# Patient Record
Sex: Female | Born: 1980 | Race: White | Hispanic: No | Marital: Married | Smoking: Current every day smoker
Health system: Southern US, Community
[De-identification: ages and names within clinical notes are randomized; demographics above are authoritative.]

## PROBLEM LIST (undated history)

## (undated) ENCOUNTER — Ambulatory Visit: Admission: EM | Payer: 59

## (undated) DIAGNOSIS — M109 Gout, unspecified: Secondary | ICD-10-CM

## (undated) DIAGNOSIS — E079 Disorder of thyroid, unspecified: Secondary | ICD-10-CM

## (undated) DIAGNOSIS — F419 Anxiety disorder, unspecified: Secondary | ICD-10-CM

## (undated) DIAGNOSIS — I471 Supraventricular tachycardia, unspecified: Secondary | ICD-10-CM

## (undated) DIAGNOSIS — G43909 Migraine, unspecified, not intractable, without status migrainosus: Secondary | ICD-10-CM

## (undated) HISTORY — DX: Anxiety disorder, unspecified: F41.9

## (undated) HISTORY — PX: HERNIA REPAIR: SHX51

## (undated) HISTORY — PX: OTHER SURGICAL HISTORY: SHX169

## (undated) HISTORY — DX: Disorder of thyroid, unspecified: E07.9

## (undated) HISTORY — DX: Migraine, unspecified, not intractable, without status migrainosus: G43.909

## (undated) HISTORY — PX: WISDOM TOOTH EXTRACTION: SHX21

---

## 2005-03-01 ENCOUNTER — Emergency Department: Payer: Self-pay | Admitting: General Practice

## 2006-01-22 ENCOUNTER — Emergency Department: Payer: Self-pay | Admitting: Emergency Medicine

## 2009-07-30 ENCOUNTER — Emergency Department: Payer: Self-pay | Admitting: Emergency Medicine

## 2009-09-12 ENCOUNTER — Emergency Department: Payer: Self-pay | Admitting: Emergency Medicine

## 2009-12-30 ENCOUNTER — Inpatient Hospital Stay: Payer: Self-pay | Admitting: Internal Medicine

## 2011-01-15 ENCOUNTER — Emergency Department: Payer: Self-pay | Admitting: Emergency Medicine

## 2012-02-13 ENCOUNTER — Emergency Department: Payer: Self-pay | Admitting: Emergency Medicine

## 2012-02-13 LAB — URINALYSIS, COMPLETE
Bilirubin,UR: NEGATIVE
Glucose,UR: NEGATIVE mg/dL (ref 0–75)
Nitrite: NEGATIVE
Ph: 6 (ref 4.5–8.0)
Protein: 100
Squamous Epithelial: 8

## 2012-02-13 LAB — CBC
HCT: 39.9 % (ref 35.0–47.0)
MCH: 37.3 pg — ABNORMAL HIGH (ref 26.0–34.0)
MCHC: 33.4 g/dL (ref 32.0–36.0)
MCV: 112 fL — ABNORMAL HIGH (ref 80–100)
RBC: 3.57 10*6/uL — ABNORMAL LOW (ref 3.80–5.20)

## 2012-02-13 LAB — COMPREHENSIVE METABOLIC PANEL
Calcium, Total: 8.8 mg/dL (ref 8.5–10.1)
Chloride: 105 mmol/L (ref 98–107)
EGFR (African American): >60
EGFR (Non-African Amer.): >60
Potassium: 3.4 mmol/L — ABNORMAL LOW (ref 3.5–5.1)
Sodium: 135 mmol/L — ABNORMAL LOW (ref 136–145)
Total Protein: 6.9 g/dL (ref 6.4–8.2)

## 2012-02-13 LAB — HCG, QUANTITATIVE, PREGNANCY: Beta Hcg, Quant.: 136 m[IU]/mL — ABNORMAL HIGH

## 2014-07-19 ENCOUNTER — Emergency Department: Payer: Self-pay | Admitting: Student

## 2014-09-17 ENCOUNTER — Observation Stay: Payer: Self-pay | Admitting: Certified Nurse Midwife

## 2014-10-03 ENCOUNTER — Observation Stay: Payer: Self-pay | Admitting: Obstetrics and Gynecology

## 2014-10-04 ENCOUNTER — Inpatient Hospital Stay: Payer: Self-pay | Admitting: Obstetrics and Gynecology

## 2014-10-04 LAB — CBC WITH DIFFERENTIAL/PLATELET
Basophil #: 0.1 10*3/uL (ref 0.0–0.1)
Basophil %: 0.3 %
Eosinophil #: 0.1 10*3/uL (ref 0.0–0.7)
Eosinophil %: 1 %
HCT: 42.7 % (ref 35.0–47.0)
HGB: 14.4 g/dL (ref 12.0–16.0)
Lymphocyte #: 2.1 10*3/uL (ref 1.0–3.6)
Lymphocyte %: 14.1 %
MCH: 32.8 pg (ref 26.0–34.0)
MCHC: 33.8 g/dL (ref 32.0–36.0)
MCV: 97 fL (ref 80–100)
MONOS PCT: 9.2 %
Monocyte #: 1.4 x10 3/mm — ABNORMAL HIGH (ref 0.2–0.9)
NEUTROS ABS: 11.2 10*3/uL — AB (ref 1.4–6.5)
Neutrophil %: 75.4 %
Platelet: 261 10*3/uL (ref 150–440)
RBC: 4.4 10*6/uL (ref 3.80–5.20)
RDW: 13.9 % (ref 11.5–14.5)
WBC: 14.9 10*3/uL — ABNORMAL HIGH (ref 3.6–11.0)

## 2014-10-05 LAB — HEMATOCRIT: HCT: 38.1 % (ref 35.0–47.0)

## 2015-01-13 NOTE — H&P (Signed)
L&D Evaluation:  History Expanded:  HPI Pt is a 34 yo G4P1021 at 2820w5d weeks GA with an EDC of 09/29/14 presents to l&d with reports of regular uterine contractions since 1130 last night. She reports +FM, denies vb and lof. Her prenatal course is significant for smoking, and hypothyroidism (she takes no medication for this). She is A+, RI, VI, GBS negative and Tdap/Flu vaccine UTD.   Blood Type (Maternal) A positive   Group B Strep Results Maternal (Result >5wks must be treated as unknown) negative  09/04/14   Maternal HIV Negative   Maternal Syphilis Ab Nonreactive   Maternal Varicella Immune   Rubella Results (Maternal) immune   Patient's Medical History Thyroid Disease  migraines, anxiety   Patient's Surgical History hernia repair, wisdom teeth   Medications Pre Natal Vitamins   Allergies NKDA   Social History tobacco  5 cigs/day   Family History Non-Contributory   ROS:  ROS All systems were reviewed.  HEENT, CNS, GI, GU, Respiratory, CV, Renal and Musculoskeletal systems were found to be normal.   Exam:  Vital Signs T98.4, P83, BP134/75, RR18, 100%RA   General no apparent distress   Mental Status clear   Chest mild inspiratory wheezing at bases bilaterally   Heart normal sinus rhythm   Abdomen gravid, non-tender   Estimated Fetal Weight 8.5lb   Fetal Position ceph   Back no CVAT   Edema no edema   Pelvic no external lesions, 7cm per RN at admission   Mebranes Intact   FHT normal rate with no decels, 135, moderate variability, +accels, occasional periodic variable decelerations (shallow, short duration)   Ucx irregular, 3 q 10 min   Skin dry, no lesions, no rashes   Lymph no lymphadenopathy   Impression:  Impression reactive NST, 1) Intrauterine pregnancy at 5620w5d gestational age, 2) Active labor   Plan:  Comments 1) Labor: expectant management, patient has SROM(@320am ) with light meconium.  2) Fetus - category I tracing overall  3) PNL A  positive / ABSC negative / RI / VZI / HIV neg / RPR NR / HBsAg neg / 1st trimester screen negative / msAFP negative / 1-hr OGTT 142, passed 3h with 1 abnormal at 2h / GBS negative / total weight gain this pregnancy = ~50lb   4) TDAP/flu vaccines given 08/07/14  5) Disposition - home postpartum   Labs:  Lab Results:  Routine Hem:  30-Jan-16 00:23   WBC (CBC)  14.9  Hemoglobin (CBC) 14.4  Hematocrit (CBC) 42.7  Platelet Count (CBC) 261   Electronic Signatures: Conard NovakJackson, Stephen D (MD)  (Signed 30-Jan-16 07:11)  Authored: L&D Evaluation, Labs   Last Updated: 30-Jan-16 07:11 by Conard NovakJackson, Stephen D (MD)

## 2015-01-13 NOTE — H&P (Signed)
L&D Evaluation:  History:  HPI Pt is a 34 yo G4P1021 at 38.[redacted] weeks GA with an EDC of 09/29/14 presents to l&d with reports of back pain and pressure. She reports that she has had the back pain for several weeks but it is now getting worse to the point to where she is having a hard time walking and sitting on the toilet is painful. She reports calling the office and was told that there were no open appointments and that she should go to the hospital. She reports +FM, denies vb, lof or ctx. She also denies any dysuria,  Her prenatal course is significant for smoking, and hypothyroidism. She is A+, RI, VI, GBS negative and Tdap/Flu vaccine UTD.   Presents with back pain   Patient's Medical History Thyroid Disease  migraines, anxiety   Patient's Surgical History hernia repair, wisdom teeth   Medications Pre Natal Vitamins   Allergies NKDA   Social History tobacco   Family History Non-Contributory   ROS:  ROS All systems were reviewed.  HEENT, CNS, GI, GU, Respiratory, CV, Renal and Musculoskeletal systems were found to be normal.   Exam:  Vital Signs stable   General no apparent distress   Mental Status clear   Chest clear   Heart normal sinus rhythm   Abdomen gravid, non-tender   Mebranes Intact   FHT normal rate with no decels, 135-140s, moderate variability, +accels, no decels   Ucx irregular, occassional ctx with some uterine irritability   Skin dry, no lesions, no rashes   Lymph no lymphadenopathy   Impression:  Impression reactive NST, back pain   Plan:  Plan discharge, comfort measures discussed   Follow Up Appointment already scheduled   Electronic Signatures: Jannet MantisSubudhi, Millee Denise (CNM)  (Signed 13-Jan-16 20:53)  Authored: L&D Evaluation   Last Updated: 13-Jan-16 20:53 by Jannet MantisSubudhi, Neli Fofana (CNM)

## 2016-07-10 ENCOUNTER — Emergency Department: Payer: Medicaid Other

## 2016-07-10 ENCOUNTER — Encounter: Payer: Self-pay | Admitting: Emergency Medicine

## 2016-07-10 ENCOUNTER — Emergency Department
Admission: EM | Admit: 2016-07-10 | Discharge: 2016-07-10 | Disposition: A | Discharge status: Home / Self Care | Payer: Medicaid Other | Attending: Emergency Medicine | Admitting: Emergency Medicine

## 2016-07-10 DIAGNOSIS — M545 Low back pain, unspecified: Secondary | ICD-10-CM

## 2016-07-10 LAB — POCT PREGNANCY, URINE: Preg Test, Ur: NEGATIVE

## 2016-07-10 MED ORDER — MELOXICAM 15 MG PO TABS: 15.0000 mg | ORAL_TABLET | Freq: Every day | ORAL | 2 refills | Status: DC

## 2016-07-10 MED ORDER — HYDROCODONE-ACETAMINOPHEN 5-325 MG PO TABS
2.0000 | ORAL_TABLET | Freq: Once | ORAL | Status: AC
  Administered 2016-07-10: 2 via ORAL
  Filled 2016-07-10: qty 2

## 2016-07-10 MED ORDER — HYDROCODONE-ACETAMINOPHEN 5-325 MG PO TABS: 1.0000 | ORAL_TABLET | ORAL | 0 refills | Status: DC | PRN

## 2016-07-10 NOTE — ED Provider Notes (Signed)
[facility] Emergency Department Provider Note  ____________________________________________   First MD Initiated Contact with Patient 07/10/16 1207     (approximate)  I have reviewed the triage vital signs and the nursing notes.   HISTORY  Chief Complaint Back Pain   HPI Bethany Castaneda is a 35 y.o. female is here with complaint of back pain which originates in the center of back and radiates down the right side of her leg. Patient denies any incontinence of bowel or bladder. Patient states that she has not had any urinary tract symptoms or history of kidney stones. Patient states that she has had back pain off and on since her children were born. Her youngest child is now 7617. Patient states she is not to follow-up or see anyone about her back. Patient states she's had low back pain for the last 4 days. She denies any fever, chills, nausea or vomiting. She denies any hematuria. Patient states that she has not had her back x-rayed.Currently she rates her pain as 10 over 10.   History reviewed. No pertinent past medical history.  There are no active problems to display for this patient.   No past surgical history on file.  Prior to Admission medications   Medication Sig Start Date End Date Taking? Authorizing Provider  HYDROcodone-acetaminophen (NORCO/VICODIN) 5-325 MG tablet Take 1 tablet by mouth every 4 (four) hours as needed for moderate pain. 07/10/16   Tommi Rumpshonda L Raysha Tilmon, PA-C  meloxicam (MOBIC) 15 MG tablet Take 1 tablet (15 mg total) by mouth daily. 07/10/16 07/10/17  Tommi Rumpshonda L Jorgina Binning, PA-C    Allergies Patient has no known allergies.  No family history on file.  Social History Social History  Substance Use Topics  . Smoking status: Not on file  . Smokeless tobacco: Not on file  . Alcohol use Not on file    Review of Systems Constitutional: No fever/chills ENT: No sore throat. Cardiovascular: Denies chest pain. Respiratory: Denies  shortness of breath. Gastrointestinal: No abdominal pain.  No nausea, no vomiting.  No diarrhea.  No constipation. Genitourinary: Negative for dysuria. Musculoskeletal: Positive for low back pain. Skin: Negative for rash. Neurological: Negative for headaches, focal weakness or numbness.  10-point ROS otherwise negative.  ____________________________________________   PHYSICAL EXAM:  VITAL SIGNS: ED Triage Vitals  Enc Vitals Group     BP 07/10/16 1124 134/85     Pulse Rate 07/10/16 1124 87     Resp 07/10/16 1124 16     Temp 07/10/16 1124 98.7 F (37.1 C)     Temp Source 07/10/16 1124 Oral     SpO2 07/10/16 1124 98 %     Weight 07/10/16 1124 155 lb (70.3 kg)     Height 07/10/16 1124 5\' 6"  (1.676 m)     Head Circumference --      Peak Flow --      Pain Score 07/10/16 1129 10     Pain Loc --      Pain Edu? --      Excl. in GC? --     Constitutional: Alert and oriented. Well appearing and in no acute distress. Eyes: Conjunctivae are normal. PERRL. EOMI. Head: Atraumatic. Nose: No congestion/rhinnorhea. Neck: No stridor.   Cardiovascular: Normal rate, regular rhythm. Grossly normal heart sounds.  Good peripheral circulation. Respiratory: Normal respiratory effort.  No retractions. Lungs CTAB. Musculoskeletal: On examination there is no gross deformity noted. There is tenderness on palpation of the L5-S1 S1 sacral area and paravertebral  muscles bilaterally. Straight leg raises were approximately 40 with discomfort bilaterally. Good muscle strength was noted bilaterally. No muscle spasms were seen. Neurologic:  Normal speech and language. No gross focal neurologic deficits are appreciated. No gait instability. Reflexes are 2+ bilaterally. Skin:  Skin is warm, dry and intact. No rash noted. Psychiatric: Mood and affect are normal. Speech and behavior are normal.  ____________________________________________   LABS (all labs ordered are listed, but only abnormal results are  displayed)  Labs Reviewed  POC URINE PREG, ED  POCT PREGNANCY, URINE   ____________________________________________  RADIOLOGY Lumbar spine x-ray per radiologist: Mild degenerative change without acute abnormality  I, Tommi Rumpshonda L Ailany Koren, personally viewed and evaluated these images (plain radiographs) as part of my medical decision making, as well as reviewing the written report by the radiologist.  ____________________________________________   PROCEDURES  Procedure(s) performed: None  Procedures  Critical Care performed: No  ____________________________________________   INITIAL IMPRESSION / ASSESSMENT AND PLAN / ED COURSE  Pertinent labs & imaging results that were available during my care of the patient were reviewed by me and considered in my medical decision making (see chart for details).    Clinical Course   While in the emergency room patient was given Norco 2 tablets prior to x-rays. She appears to be resting without severe pain. Patient was given a prescription for meloxicam 15 mg one daily with food along with Norco No. 15 tablets. Patient is to follow-up with Dr. Martha ClanKrasinski if any continued problems with her back. She is also encouraged to use ice or heat to her back as needed.  ____________________________________________   FINAL CLINICAL IMPRESSION(S) / ED DIAGNOSES  Final diagnoses:  Acute bilateral low back pain without sciatica      NEW MEDICATIONS STARTED DURING THIS VISIT:  Discharge Medication List as of 07/10/2016  2:33 PM    START taking these medications   Details  HYDROcodone-acetaminophen (NORCO/VICODIN) 5-325 MG tablet Take 1 tablet by mouth every 4 (four) hours as needed for moderate pain., Starting Sun 07/10/2016, Print    meloxicam (MOBIC) 15 MG tablet Take 1 tablet (15 mg total) by mouth daily., Starting Sun 07/10/2016, Until Mon 07/10/2017, Print         Note:  This document was prepared using Dragon voice recognition software  and may include unintentional dictation errors.    Tommi RumpsRhonda L Jovi Alvizo, PA-C 07/10/16 1530    Minna AntisKevin Paduchowski, MD 07/10/16 1531

## 2016-07-10 NOTE — ED Triage Notes (Signed)
Pt reports low back pain for four days. Pt denies dysuria. Pt denies fever. Pt denies NVD.

## 2016-07-10 NOTE — Discharge Instructions (Signed)
Follow-up with Dr. Martha ClanKrasinski if any continued problems with your back. Take medication only as directed. Meloxicam 15 mg once daily with food. Norco as needed for severe pain. Do not operate machinery or drive while taking Norco as this could cause drowsiness. Use ice or heat to your back as needed for comfort.

## 2016-07-10 NOTE — ED Notes (Signed)
Pain in center of back with radiation outward bilaterally and down right leg

## 2016-11-28 ENCOUNTER — Ambulatory Visit (INDEPENDENT_AMBULATORY_CARE_PROVIDER_SITE_OTHER): Payer: Medicaid Other | Admitting: Obstetrics and Gynecology

## 2016-11-28 ENCOUNTER — Encounter: Payer: Self-pay | Admitting: Obstetrics and Gynecology

## 2016-11-28 VITALS — BP 118/74 | Ht 66.0 in | Wt 151.0 lb

## 2016-11-28 DIAGNOSIS — Z01419 Encounter for gynecological examination (general) (routine) without abnormal findings: Secondary | ICD-10-CM | POA: Diagnosis not present

## 2016-11-28 DIAGNOSIS — F17200 Nicotine dependence, unspecified, uncomplicated: Secondary | ICD-10-CM

## 2016-11-28 DIAGNOSIS — Z1339 Encounter for screening examination for other mental health and behavioral disorders: Secondary | ICD-10-CM

## 2016-11-28 DIAGNOSIS — N871 Moderate cervical dysplasia: Secondary | ICD-10-CM

## 2016-11-28 DIAGNOSIS — N946 Dysmenorrhea, unspecified: Secondary | ICD-10-CM

## 2016-11-28 DIAGNOSIS — Z1389 Encounter for screening for other disorder: Secondary | ICD-10-CM

## 2016-11-28 DIAGNOSIS — Z1331 Encounter for screening for depression: Secondary | ICD-10-CM

## 2016-11-28 DIAGNOSIS — Z30014 Encounter for initial prescription of intrauterine contraceptive device: Secondary | ICD-10-CM

## 2016-11-28 DIAGNOSIS — D069 Carcinoma in situ of cervix, unspecified: Secondary | ICD-10-CM | POA: Insufficient documentation

## 2016-11-28 DIAGNOSIS — N920 Excessive and frequent menstruation with regular cycle: Secondary | ICD-10-CM

## 2016-11-28 NOTE — Progress Notes (Signed)
Gynecology Annual Exam  PCP: Pcp Not In System  Chief Complaint  Patient presents with  . Annual Exam  . Contraception    History of Present Illness:  [name] is a 36 y.o. [id number] who LMP was Patient's last menstrual period was 11/23/2016., presents today for her annual examination.  Her menses are regular every 28-30 days, lasting 7 day(s).  Dysmenorrhea moderate, occurring first 1-2 days of flow. She does not have intermenstrual bleeding.  She is single partner, contraception - condoms most of the time.  Last Pap: nearly 3 years ago.  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 No followup due to transportation issues.  Hx of STDs: none  Last mammogram: n/a There is no FH of breast cancer. There is no FH of ovarian cancer. The patient does not do self-breast exams.  Tobacco use: The patient currently smokes 1 packs of cigarettes per day Alcohol use: social drinker Exercise: not active  She also desires contraception. She is currently using condoms. She has two children and wants no more.  She does not want a tubal ligation.  She is asking about a Mirena IUD.   The patient wears seatbelts: yes.     Review of Systems: Review of Systems  Constitutional: Negative.   HENT: Negative.   Eyes: Negative.   Respiratory: Negative.   Cardiovascular: Negative.   Gastrointestinal: Negative.   Genitourinary: Negative.   Musculoskeletal: Negative.   Skin: Negative.   Neurological: Negative.   Psychiatric/Behavioral: Negative.    Past Medical HIstory: none History reviewed. No pertinent past medical history.  Past Surgical History:  Procedure Laterality Date  . HERNIA REPAIR      Medications: None    Allergies:  No Known Allergies  Gynecologic History: Patient's last menstrual period was 11/23/2016.  Obstetric History: [id number]  Social History   Social History  . Marital status: Married    Spouse name: N/A  . Number of children: N/A  . Years of  education: N/A   Occupational History  . Not on file.   Social History Main Topics  . Smoking status: Current Every Day Smoker  . Smokeless tobacco: Never Used  . Alcohol use Yes  . Drug use: Unknown  . Sexual activity: Yes    Birth control/ protection: None   Other Topics Concern  . Not on file   Social History Narrative  . No narrative on file    Family History:  Denies history of gynecologic cancer  Physical Exam BP 118/74   Ht 5\' 6"  (1.676 m)   Wt 151 lb (68.5 kg)   LMP 11/23/2016   BMI 24.37 kg/m    General: NAD HEENT: normocephalic, anicteric Thyroid: no enlargement, no palpable nodules Pulmonary: No increased work of breathing, CTAB Cardiovascular: RRR, distal pulses 2+ Breast: Breast symmetrical, no tenderness, no palpable nodules or masses, no skin or nipple retraction present, no nipple discharge.  No axillary or supraclavicular lymphadenopathy. Abdomen: NABS, soft, non-tender, non-distended.  Umbilicus without lesions.  No hepatomegaly, splenomegaly or masses palpable. No evidence of hernia  Genitourinary:  External: Normal external female genitalia.  Normal urethral meatus, normal  Bartholin's and Skene's glands.    Vagina: Normal vaginal mucosa, no evidence of prolapse.    Cervix: Grossly normal in appearance, no bleeding  Uterus: Non-enlarged, mobile, normal contour.  No CMT  Adnexa: ovaries non-enlarged, no adnexal masses  Rectal: deferred  Lymphatic: no evidence of inguinal lymphadenopathy Extremities: no edema, erythema, or tenderness Neurologic: Grossly  intact Psychiatric: mood appropriate, affect full  Female chaperone present for pelvic and breast  portions of the physical exam  Results: AUDIT Questionnaire (screen for alcoholism): 4 PHQ-9: 0   Assessment: 36 y.o. 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    Plan: -- Blood pressure screen normal. -- Mammogram - not due -- Weight screening: normal -- Depression screening negative (PHQ-9) -- Nutrition: normal -- cholesterol screening: n/a -- osteoporosis screening: n/a -- tobacco screening: discussed quitting using the 5 A's. She is not ready at this time. -- alcohol screening: AUDIT questionnaire indicates low-risk usage. -- family history of breast cancer screening: done. not at high risk. -- no evidence of domestic violence or intimate partner violence. -- STD screening: gonorrhea/chlamydia NAAT collected. -- pap smear collected.  Reviewed all forms of birth control options available including abstinence; over the counter/barrier methods; hormonal contraceptive medication including pill, patch, ring, injection,contraceptive implant; hormonal and nonhormonal IUDs; permanent sterilization options including vasectomy and the various tubal sterilization modalities. Risks and benefits reviewed.  Questions were answered.  Information was given to patient to review.  She would like a Mirena IUD placed.  Will schedule.  Plan to get Pap smear results back prior to placing IUD because she is at high risk of needing colposcopy for any abnormal pap smear.   Thomasene MohairStephen Lexington Devine, MD 11/28/2016 3:51 PM

## 2016-12-03 LAB — IGP,CTNG,APTIMAHPV,RFX16/18,45
CHLAMYDIA, NUC. ACID AMP: NEGATIVE
GONOCOCCUS BY NUCLEIC ACID AMP: NEGATIVE
HPV APTIMA: POSITIVE — AB
PAP SMEAR COMMENT: 0

## 2016-12-07 ENCOUNTER — Telehealth: Payer: Self-pay | Admitting: Obstetrics and Gynecology

## 2016-12-07 ENCOUNTER — Encounter: Payer: Self-pay | Admitting: Obstetrics and Gynecology

## 2016-12-07 NOTE — Telephone Encounter (Signed)
Discussed ASC-H pap smear result. Strongly recommend colposcopy given her history 2 years ago of CIN 2.  She will call to schedule colposcopy ASAP. She voiced understanding and agreement to follow up this time.

## 2017-01-02 ENCOUNTER — Ambulatory Visit (INDEPENDENT_AMBULATORY_CARE_PROVIDER_SITE_OTHER): Payer: Medicaid Other | Admitting: Obstetrics and Gynecology

## 2017-01-02 ENCOUNTER — Encounter: Payer: Self-pay | Admitting: Obstetrics and Gynecology

## 2017-01-02 ENCOUNTER — Ambulatory Visit (INDEPENDENT_AMBULATORY_CARE_PROVIDER_SITE_OTHER): Payer: Medicaid Other

## 2017-01-02 VITALS — BP 120/76 | HR 85 | Ht 66.0 in | Wt 155.0 lb

## 2017-01-02 DIAGNOSIS — D069 Carcinoma in situ of cervix, unspecified: Secondary | ICD-10-CM

## 2017-01-02 DIAGNOSIS — R87611 Atypical squamous cells cannot exclude high grade squamous intraepithelial lesion on cytologic smear of cervix (ASC-H): Secondary | ICD-10-CM | POA: Diagnosis not present

## 2017-01-02 NOTE — Progress Notes (Addendum)
HPI:  Bethany Castaneda is a 36 y.o.  [phone]  who presents today for evaluation and management of abnormal cervical cytology.    Dysplasia History:   Pap 2016: Nilm, HPV 18 positive Colposcopy 11/2014: CIN 2, no follow up until this pregnancy Pap 11/28/16: ASC-H, HPV positive  ROS:  negative  OB History  Gravida Para Term Preterm AB Living  SAB TAB Ectopic Multiple Live Births  2       2    # Outcome Date GA Lbr Len/2nd Weight Sex Delivery Anes PTL Lv  4 SAB           3 SAB           2 Term    8 lb (3.629 kg) M Vag-Spont   LIV  1 Term    8 lb (3.629 kg) F Vag-Spont   LIV      Past Medical History:  Diagnosis Date  . Anxiety   . Migraine   . Thyroid disease     Past Surgical History:  Procedure Laterality Date  . HERNIA REPAIR    . OTHER SURGICAL HISTORY     vulvar cyst removal  . WISDOM TOOTH EXTRACTION      SOCIAL HISTORY: History  Alcohol Use  . Yes   History  Drug Use No     Family History  Problem Relation Age of Onset  . Leukemia Maternal Grandmother     ALLERGIES:  Patient has no known allergies.  No current outpatient prescriptions on file prior to visit.   No current facility-administered medications on file prior to visit.     Physical Exam: -Vitals:  BP 120/76   Pulse 85   Ht  (1.676 m)   Wt 155 lb (70.3 kg)   LMP 12/30/2016 (Exact Date)   BMI 25.02 kg/m  GEN: WD, WN, NAD.  A+ O x 3, good mood and affect. ABD:  NT, ND.  Soft, no masses.  No hernias noted.   Pelvic:   Vulva: Normal appearance.  No lesions.  Vagina: No lesions or abnormalities noted.  Support: Normal pelvic support.  Urethra No masses tenderness or scarring.  Meatus Normal size without lesions or prolapse.  Cervix: See below.  Anus: Normal exam.  No lesions.  Perineum: Normal exam.  No lesions.        Bimanual   Uterus: Normal size.  Non-tender.  Mobile.  AV.  Adnexae: No masses.  Non-tender to palpation.  Cul-de-sac: Negative for  abnormality.   PROCEDURE: 1.  Urine Pregnancy Test:  negative 2.  Colposcopy performed with 4% acetic acid after verbal consent obtained                                         -Aceto-white Lesions Location(s): diffusely, but concentrated at 11-2 o'clock and posteriorly at 4-8 o'clock.              -Biopsy performed at 4, 7, 12, and 2 o'clock               -ECC indicated and performed: Yes.       -Biopsy sites made hemostatic with pressure, AgNO3, and/or Monsel's solution   -Satisfactory colposcopy: No.    -Evidence of Invasive cervical CA :  NO  ASSESSMENT:  Bethany Castaneda is a 36 y.o.  [id number] here for  1. Atypical squamous cells cannot exclude high grade squamous intraepithelial lesion on cytologic smear of cervix (ASC-H)   .  PLAN: I discussed the grading system of pap smears and HPV high risk viral types.  We will discuss and base management after colpo results return.  Thomasene Mohair, MD     [facility], [facility] 01/02/2017  5:33 PM   ADDENDUM: CIN 3 returned pathology. Spoke with patient. Recommend LEEP procedure. Explained risk, benefits, and potential impact on pregnancy.  She wishes to proceed. Will arrange for soon (next few weeks).

## 2017-01-04 ENCOUNTER — Encounter: Payer: Self-pay | Admitting: Obstetrics and Gynecology

## 2017-01-04 ENCOUNTER — Telehealth: Payer: Self-pay | Admitting: Obstetrics and Gynecology

## 2017-01-04 LAB — PATHOLOGY

## 2017-01-04 NOTE — Telephone Encounter (Signed)
Discussed biopsy results. Strongly recommend LEEP procedure.  Discussed in detail my recommendations.  Discussed risk to possible future pregnancies versus the natural course of a CIN 3 lesion. She voiced understanding and will proceed with LEEP procedure.

## 2017-01-31 ENCOUNTER — Ambulatory Visit (INDEPENDENT_AMBULATORY_CARE_PROVIDER_SITE_OTHER): Payer: Medicaid Other | Admitting: Obstetrics and Gynecology

## 2017-01-31 ENCOUNTER — Encounter: Payer: Self-pay | Admitting: Obstetrics and Gynecology

## 2017-01-31 VITALS — BP 124/76 | Ht 66.0 in | Wt 155.0 lb

## 2017-01-31 DIAGNOSIS — D069 Carcinoma in situ of cervix, unspecified: Secondary | ICD-10-CM | POA: Diagnosis not present

## 2017-01-31 MED ORDER — IBUPROFEN 600 MG PO TABS: 600.0000 mg | ORAL_TABLET | Freq: Four times a day (QID) | ORAL | 0 refills | Status: DC | PRN

## 2017-01-31 MED ORDER — HYDROCODONE-ACETAMINOPHEN 5-325 MG PO TABS: 1.0000 | ORAL_TABLET | Freq: Four times a day (QID) | ORAL | 0 refills | Status: DC | PRN

## 2017-02-02 NOTE — Progress Notes (Signed)
  LEEP PROCEDURE NOTE  The LEEP has been explained to the patient in detail; risks/benefits reviewed.  The risks include, but are not limited to, bleeding, infection, and the possibility of cervical stenosis or cervical incompetence.  The patient had previously been given information regarding abnormal PAP smears and their relationship to HPV.  We have discussed the natural course and history of HPV, the possibility of incomplete treatment by the LEEP, as well as the possibility of recurrence.  I have reviewed the consent form for LEEP with her, and she fully understands its contents.  We have discussed the procedure itself. I have informed her that following the LEEP she should refrain from intercourse and the use of tampons for three weeks, and that she should also expect some spotting and brown/black discharge over the next several days.  We have discussed the fact that vaginal bleeding, differentiated from spotting, is not normal and that if she should have this complication, she should contact me immediately.  The follow-up after LEEP will be PAP smears or viral typing performed at regular intervals for up to 3-5 years.  Should these all prove to be normal, she will then be back on typical cervical screening.  I have answered all of her questions, and I believe she has an adequate understanding of the LEEP, its implications, and the necessity of follow-up care.  I discussed her colpo results and explained the procedure of LEEP.  All questions were answered and she signed the consent form.    LEEP performed in the usual manner after reviewing the previous colpo findings and results.  An insulated speculum was inserted into the vagina and the cervix identified. Lugol's solution was used to identify any abnormal areas of the cervix.  The cervix was cleansed with betadine solution. Local injection of Lidocaine was performed for anesthesia. Ectocervical and then endocervical specimens obtained using the  loop electrodes without difficulty.  It was labeled accordingly. An ECC was then collected.  The base and edges of the defect wer then cauterized using coagulation current. Hemostasis achieved and Monsel's solution was applied to maintain hemostasis.  The speculum was removed from the patient's vagina.  She tolerated the procedure well.   Thomasene MohairStephen Christasia Angeletti, MD 02/02/2017 5:14 PM

## 2017-02-03 ENCOUNTER — Telehealth: Payer: Self-pay

## 2017-02-03 NOTE — Telephone Encounter (Signed)
Pt calling to see how long to wait after a LEEP to go swimming.  Pt aware per PH 1-2 weeks.

## 2017-02-06 LAB — PATHOLOGY

## 2017-02-09 ENCOUNTER — Encounter: Payer: Self-pay | Admitting: Obstetrics and Gynecology

## 2017-02-09 ENCOUNTER — Telehealth: Payer: Self-pay | Admitting: Obstetrics and Gynecology

## 2017-02-09 NOTE — Telephone Encounter (Signed)
Pt is calling to speak with Dr. Jean RosenthalJackson. Advise Pt Dr. Jean RosenthalJackson at hospital today will send message to have him give patient a call

## 2017-02-09 NOTE — Telephone Encounter (Signed)
Patient would like Mirena at her follow up appointment on 6/26. This has previously been discussed. I just wanted to make sure the device was available so that it could be placed. Thank you

## 2017-02-09 NOTE — Telephone Encounter (Signed)
Left generic VM. I see her in clinic in a few weeks for her post-op check. Will review results then, if she has not called back. I will also release the results through the portal so that she will get them quickly.

## 2017-02-10 NOTE — Telephone Encounter (Signed)
Noted. Will order to arrive by apt date/time. 

## 2017-02-28 ENCOUNTER — Encounter: Payer: Self-pay | Admitting: Obstetrics and Gynecology

## 2017-02-28 ENCOUNTER — Ambulatory Visit (INDEPENDENT_AMBULATORY_CARE_PROVIDER_SITE_OTHER): Payer: Medicaid Other | Admitting: Obstetrics and Gynecology

## 2017-02-28 VITALS — BP 128/78 | Wt 152.0 lb

## 2017-02-28 DIAGNOSIS — D069 Carcinoma in situ of cervix, unspecified: Secondary | ICD-10-CM

## 2017-02-28 DIAGNOSIS — N891 Moderate vaginal dysplasia: Secondary | ICD-10-CM | POA: Insufficient documentation

## 2017-02-28 DIAGNOSIS — Z30430 Encounter for insertion of intrauterine contraceptive device: Secondary | ICD-10-CM

## 2017-02-28 MED ORDER — LEVONORGESTREL 20 MCG/24HR IU IUD: 1.0000 | INTRAUTERINE_SYSTEM | Freq: Once | INTRAUTERINE | 0 refills | Status: DC

## 2017-02-28 NOTE — Telephone Encounter (Addendum)
Mirena stock reserved for this patient. 

## 2017-02-28 NOTE — Progress Notes (Signed)
   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  Subjective: Patient reports marked improvement in her preop symptoms. Eating a regular diet without difficulty. The patient is not having any pain.  Activity: normal activities of daily living.  Has not had intercourse yet. Had a painful menses.    Objective: Vitals:   02/28/17 1551  BP: 128/78   Vital Signs: BP 128/78   Wt 152 lb (68.9 kg)   LMP 02/14/2017   BMI 24.53 kg/m  Constitutional: Well nourished, well developed female in no acute distress.  HEENT: normal Skin: Warm and dry.  Extremity: no edema  Abdomen: Soft, non-tender, normal bowel sounds; no bruits, organomegaly or masses.  Pelvic exam: (female chaperone present) is not limited by body habitus EGBUS: within normal limits Vagina: within normal limits and with normal mucosa blood in the vault Cervix: well- healed, no evidence of stenosis  IUD Insertion Procedure Note Patient identified, informed consent performed, consent signed.   Discussed risks of irregular bleeding, cramping, infection, malpositioning, expulsion or uterine perforation of the IUD (1:1000 placements)  which may require further procedure such as laparoscopy.  IUD while effective at preventing pregnancy do not prevent transmission of sexually transmitted diseases and use of barrier methods for this purpose was discussed. Time out was performed.  Urine pregnancy test negative.  Speculum placed in the vagina.  Cervix visualized.  Cleaned with Betadine x 2.  Grasped anteriorly with a single tooth tenaculum.  Uterus sounded to 9 cm. IUD placed per manufacturer's recommendations.  Strings trimmed to 3 cm. Tenaculum was removed, good hemostasis noted.  Patient tolerated procedure well.   Patient was given post-procedure instructions.  She was advised to have backup contraception for one week.  Patient was also asked to check IUD strings periodically and follow up in 6 weeks  for IUD check.    Female chaperone present for pelvic exam and IUD placement.   Assessment: 36 y.o. s/p LEEP for CIN 3,  progressing well, also here for intrauterine device placement (Mirena)  Plan: Patient has done well after surgery with no apparent complications.  I have discussed the post-operative course to date, and the expected progress moving forward.  The patient understands what complications to be concerned about.  I will see the patient in routine follow up, or sooner if needed.    Follow up 4 weeks for IUD string check. Needs repeat pap smear in one year.    Thomasene MohairStephen Katelynd Blauvelt, MD 02/28/2017, 4:04 PM

## 2017-03-22 ENCOUNTER — Telehealth: Payer: Self-pay

## 2017-03-22 NOTE — Telephone Encounter (Signed)
Pt had mirena put in last month.  C/o is still bleeding pretty heavy, bloated, back is 'killing me'.  Please call.  [phone]

## 2017-03-24 NOTE — Telephone Encounter (Signed)
Pt aware this can br normal when getting an IUD while body gets used to it. Told her we would see her on 7/25 for follow up, if she needed anything else to call back

## 2017-03-29 ENCOUNTER — Ambulatory Visit (INDEPENDENT_AMBULATORY_CARE_PROVIDER_SITE_OTHER): Payer: Medicaid Other | Admitting: Obstetrics and Gynecology

## 2017-03-29 ENCOUNTER — Encounter: Payer: Self-pay | Admitting: Obstetrics and Gynecology

## 2017-03-29 VITALS — BP 118/74 | Ht 66.0 in | Wt 156.0 lb

## 2017-03-29 DIAGNOSIS — Z30431 Encounter for routine checking of intrauterine contraceptive device: Secondary | ICD-10-CM

## 2017-03-29 NOTE — Progress Notes (Signed)
    IUD String Check  Subjctive: [name] presents for IUD string check.  She had a Paragard placed 4 weeks ago.  Since placement of her IUD she had some  vaginal bleeding that just ended this past weekend. Sometimes it has been spotting and other the bleeding has been heavier.  She denies cramping or discomfort.  She has not had intercourse since placement.  She has not checked the strings.  She denies any fever, chills, nausea, vomiting, or other complaints.    Objective: BP 118/74   Ht 5\' 6"  (1.676 m)   Wt 156 lb (70.8 kg)   BMI 25.18 kg/m  Physical Exam  Constitutional: She is oriented to person, place, and time. She appears well-developed and well-nourished. No distress.  HENT:  Head: Normocephalic and atraumatic.  Nose: Nose normal.  Eyes: Conjunctivae are normal.  Neck: Normal range of motion.  Cardiovascular: Normal rate, regular rhythm and normal heart sounds.   Pulmonary/Chest: Effort normal and breath sounds normal.  Abdominal: Soft. She exhibits no distension. There is no tenderness. There is no rebound and no guarding.  Genitourinary: Uterus normal. Pelvic exam was performed with patient supine. There is no lesion on the right labia. There is no lesion on the left labia. Uterus is not tender. Cervix exhibits no motion tenderness and no discharge. Right adnexum displays no mass. Left adnexum displays no mass. No vaginal discharge found.  Genitourinary Comments: IUD strings present  Musculoskeletal: Normal range of motion.  Lymphadenopathy:       Right: No inguinal adenopathy present.       Left: No inguinal adenopathy present.  Neurological: She is alert and oriented to person, place, and time.  Skin: Skin is warm and dry. No rash noted.  Psychiatric: She has a normal mood and affect. Her behavior is normal.   Female chaperone was present for the entirety of the pelvic exam  Assessment: 36 y.o. year old female status post prior Mirena IUD placement 4 week  ago, doing well.  Plan: 1.  The patient was given instructions to check her IUD strings monthly and call with any problems or concerns.  She should call for fevers, chills, abnormal vaginal discharge, pelvic pain, or other complaints. 2.  She will return for a annual exam in 1 year.  All questions answered.  Thomasene MohairStephen Janvi Ammar, MD 03/29/2017 4:14 PM

## 2018-01-01 ENCOUNTER — Emergency Department: Payer: Medicaid Other

## 2018-01-01 ENCOUNTER — Encounter: Payer: Self-pay | Admitting: Emergency Medicine

## 2018-01-01 ENCOUNTER — Emergency Department
Admission: EM | Admit: 2018-01-01 | Discharge: 2018-01-01 | Disposition: A | Discharge status: Home / Self Care | Payer: Medicaid Other | Attending: Emergency Medicine | Admitting: Emergency Medicine

## 2018-01-01 ENCOUNTER — Other Ambulatory Visit: Payer: Self-pay

## 2018-01-01 DIAGNOSIS — R0789 Other chest pain: Secondary | ICD-10-CM | POA: Insufficient documentation

## 2018-01-01 DIAGNOSIS — F17210 Nicotine dependence, cigarettes, uncomplicated: Secondary | ICD-10-CM | POA: Diagnosis not present

## 2018-01-01 DIAGNOSIS — R0781 Pleurodynia: Secondary | ICD-10-CM

## 2018-01-01 DIAGNOSIS — W19XXXA Unspecified fall, initial encounter: Secondary | ICD-10-CM

## 2018-01-01 LAB — URINALYSIS, COMPLETE (UACMP) WITH MICROSCOPIC
BILIRUBIN URINE: NEGATIVE
GLUCOSE, UA: NEGATIVE mg/dL
KETONES UR: NEGATIVE mg/dL
Leukocytes, UA: NEGATIVE
Nitrite: NEGATIVE
PH: 6 (ref 5.0–8.0)
Protein, ur: 30 mg/dL — AB
Specific Gravity, Urine: 1.016 (ref 1.005–1.030)
WBC UA: NONE SEEN WBC/hpf (ref 0–5)

## 2018-01-01 LAB — POCT PREGNANCY, URINE: Preg Test, Ur: NEGATIVE

## 2018-01-01 MED ORDER — IBUPROFEN 600 MG PO TABS: 600.0000 mg | ORAL_TABLET | Freq: Four times a day (QID) | ORAL | 0 refills | Status: DC | PRN

## 2018-01-01 MED ORDER — LIDOCAINE 5 % EX PTCH: 1.0000 | MEDICATED_PATCH | Freq: Two times a day (BID) | CUTANEOUS | 0 refills | Status: AC

## 2018-01-01 MED ORDER — CYCLOBENZAPRINE HCL 5 MG PO TABS: ORAL_TABLET | ORAL | 0 refills | Status: DC

## 2018-01-01 MED ORDER — OXYCODONE-ACETAMINOPHEN 5-325 MG PO TABS
1.0000 | ORAL_TABLET | Freq: Once | ORAL | Status: AC
  Administered 2018-01-01: 1 via ORAL
  Filled 2018-01-01: qty 1

## 2018-01-01 NOTE — ED Provider Notes (Signed)
[facility] Emergency Department Provider Note  ____________________________________________  Time seen: Approximately 7:34 AM  I have reviewed the triage vital signs and the nursing notes.   HISTORY  Chief Complaint Fall    HPI Bethany Castaneda is a 37 y.o. female that presents to the emergency department for evaluation of left-sided rib pain after falling this morning.  Patient states that she has a bone spur that sometimes "catches." This morning she felt her back lock up and she fell on a coffee table.  She has having pain on her left ribs and feels her ribs "rolling around."  She did not hit her head or lose consciousness.  No additional injuries.  She still has some minor pain in her low back but her ribs hurt worse.  No shortness of breath, chest pain, nausea, vomiting, abdominal pain.   Past Medical History:  Diagnosis Date  . Anxiety   . Migraine   . Thyroid disease     Patient Active Problem List   Diagnosis Date Noted  . 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 HERNIA REPAIR    . OTHER SURGICAL HISTORY     vulvar cyst removal  . WISDOM TOOTH EXTRACTION      Prior to Admission medications   Medication Sig Start Date End Date Taking? Authorizing Provider  cyclobenzaprine (FLEXERIL) 5 MG tablet Take 1-2 tablets 3 times daily as needed 01/01/18   Enid Derry, PA-C  HYDROcodone-acetaminophen (NORCO) 5-325 MG tablet Take 1 tablet by mouth every 6 (six) hours as needed for moderate pain. 01/31/17   Conard Novak, MD  ibuprofen (ADVIL,MOTRIN) 600 MG tablet Take 1 tablet (600 mg total) by mouth every 6 (six) hours as needed. 01/01/18   Enid Derry, PA-C  levonorgestrel (MIRENA) 20 MCG/24HR IUD 1 Intra Uterine Device (1 each total) by Intrauterine route once. 02/28/17  02/28/17  Conard Novak, MD  lidocaine (LIDODERM) 5 % Place 1 patch onto the skin every 12 (twelve) hours. Remove & Discard patch within 12 hours or as directed by MD 01/01/18 01/01/19  Enid Derry, PA-C    Allergies Patient has no known allergies.  Family History  Problem Relation Age of Onset  . Leukemia Maternal Grandmother     Social History Social History   Tobacco Use  . Smoking status: Current Every Day Smoker    Packs/day: 1.00    Types: Cigarettes  . Smokeless tobacco: Never Used  Substance Use Topics  . Alcohol use: Yes  . Drug use: No     Review of Systems  Constitutional: No fever/chills Respiratory: No SOB. Gastrointestinal: No abdominal pain.  No nausea, no vomiting.  Musculoskeletal: Positive for rib pain.  Skin: Negative for rash, abrasions, lacerations, ecchymosis. Neurological: Negative for headaches, numbness or tingling   ____________________________________________   PHYSICAL EXAM:  VITAL SIGNS: ED Triage Vitals  Enc Vitals Group     BP 01/01/18 0516 (!) 144/100     Pulse Rate 01/01/18 0516 (!) 105     Resp 01/01/18 0516 18     Temp 01/01/18 0516 98.7 F (37.1 C)     Temp Source 01/01/18 0516 Oral     SpO2 01/01/18 0516 98 %     Weight 01/01/18 0517 160 lb (72.6 kg)     Height 01/01/18 0517  (1.702 m)  Head Circumference --      Peak Flow --      Pain Score 01/01/18 0517 10     Pain Loc --      Pain Edu? --      Excl. in GC? --      Constitutional: Alert and oriented. Well appearing and in no acute distress. Eyes: Conjunctivae are normal. PERRL. EOMI. Head: Atraumatic. ENT:      Ears:      Nose: No congestion/rhinnorhea.      Mouth/Throat: Mucous membranes are moist.  Neck: No stridor. No cervical spine tenderness to palpation. Cardiovascular: Normal rate, regular rhythm.  Good peripheral circulation. Respiratory: Normal respiratory effort without tachypnea or retractions. Lungs CTAB. Good air entry to the bases  with no decreased or absent breath sounds. Gastrointestinal: Bowel sounds 4 quadrants. Soft and nontender to palpation. No guarding or rigidity. No palpable masses. No distention.  Musculoskeletal: Full range of motion to all extremities. No gross deformities appreciated.  Tenderness to palpation over left posterior inferior rib cage.  No ecchymosis. Neurologic:  Normal speech and language. No gross focal neurologic deficits are appreciated.  Skin:  Skin is warm, dry and intact. No rash noted. Psychiatric: Mood and affect are normal. Speech and behavior are normal. Patient exhibits appropriate insight and judgement.   ____________________________________________   LABS (all labs ordered are listed, but only abnormal results are displayed)  Labs Reviewed  URINALYSIS, COMPLETE (UACMP) WITH MICROSCOPIC - Abnormal; Notable for the following components:      Result Value   Color, Urine YELLOW (*)    APPearance HAZY (*)    Hgb urine dipstick SMALL (*)    Protein, ur 30 (*)    Bacteria, UA RARE (*)    All other components within normal limits  POCT PREGNANCY, URINE   ____________________________________________  EKG   ____________________________________________  RADIOLOGY Lexine Baton, personally viewed and evaluated these images (plain radiographs) as part of my medical decision making, as well as reviewing the written report by the radiologist.  Dg Chest 1 View  Result Date: 01/01/2018 CLINICAL DATA:  Low left rib pain after a fall. EXAM: CHEST  1 VIEW COMPARISON:  12/31/2009 FINDINGS: Normal heart size and pulmonary vascularity. No focal airspace disease or consolidation in the lungs. No blunting of costophrenic angles. No pneumothorax. Mediastinal contours appear intact. IMPRESSION: No active disease. Electronically Signed   By: Burman Nieves M.D.   On: 01/01/2018 05:39   Dg Ribs Unilateral Left  Result Date: 01/01/2018 CLINICAL DATA:  Fall.  Left rib pain EXAM: LEFT  RIBS - 2 VIEW COMPARISON:  01/01/2018 FINDINGS: No fracture or other bone lesions are seen involving the ribs. Visualized left lung clear. No effusion or pneumothorax. IMPRESSION: Negative. Electronically Signed   By: Charlett Nose M.D.   On: 01/01/2018 07:54    ____________________________________________    PROCEDURES  Procedure(s) performed:    Procedures    Medications  oxyCODONE-acetaminophen (PERCOCET/ROXICET) 5-325 MG per tablet 1 tablet (1 tablet Oral Given 01/01/18 0736)     ____________________________________________   INITIAL IMPRESSION / ASSESSMENT AND PLAN / ED COURSE  Pertinent labs & imaging results that were available during my care of the patient were reviewed by me and considered in my medical decision making (see chart for details).  Review of the [place] CSRS was performed in accordance of the [facility] prior to dispensing any controlled drugs.     Patient presented to the emergency department for evaluation of rib pain after  fall this morning.  Vital signs and exam are reassuring.  Rib and chest x-ray are negative for acute abnormalities.  Patient will be discharged home with prescriptions for Advil, Flexeril, Lidoderm. Patient is to follow up with PCP as directed. Patient is given ED precautions to return to the ED for any worsening or new symptoms.     ____________________________________________  FINAL CLINICAL IMPRESSION(S) / ED DIAGNOSES  Final diagnoses:  Fall  Rib pain      NEW MEDICATIONS STARTED DURING THIS VISIT:  ED Discharge Orders        Ordered    ibuprofen (ADVIL,MOTRIN) 600 MG tablet  Every 6 hours PRN     01/01/18 0854    cyclobenzaprine (FLEXERIL) 5 MG tablet     01/01/18 0854    lidocaine (LIDODERM) 5 %  Every 12 hours     01/01/18 0854          This chart was dictated using voice recognition software/Dragon. Despite best efforts to proofread, errors can occur which can change the meaning. Any change was purely  unintentional.    Enid Derry, PA-C 01/01/18 1136    Don Perking, [place], MD 01/03/18 [id number]

## 2018-01-01 NOTE — ED Triage Notes (Signed)
Pt reports that when she stood up to clean the coffee table off around 0400 her back which she has chronic problems with gave out and she fell. Pt states that her ribs are "jiggling" at this time. Pt denies LOC and is in NAD.

## 2018-01-01 NOTE — ED Notes (Signed)
See triage note  States she fell  Landed on coffee table  Having pain to left lateral rib area

## 2019-07-01 ENCOUNTER — Emergency Department
Admission: EM | Admit: 2019-07-01 | Discharge: 2019-07-01 | Disposition: A | Discharge status: Home / Self Care | Payer: Medicaid Other | Attending: Emergency Medicine | Admitting: Emergency Medicine

## 2019-07-01 ENCOUNTER — Other Ambulatory Visit: Payer: Self-pay

## 2019-07-01 DIAGNOSIS — R519 Headache, unspecified: Secondary | ICD-10-CM | POA: Diagnosis present

## 2019-07-01 DIAGNOSIS — F17210 Nicotine dependence, cigarettes, uncomplicated: Secondary | ICD-10-CM | POA: Diagnosis not present

## 2019-07-01 DIAGNOSIS — R42 Dizziness and giddiness: Secondary | ICD-10-CM | POA: Diagnosis not present

## 2019-07-01 DIAGNOSIS — R55 Syncope and collapse: Secondary | ICD-10-CM | POA: Insufficient documentation

## 2019-07-01 DIAGNOSIS — G43709 Chronic migraine without aura, not intractable, without status migrainosus: Secondary | ICD-10-CM

## 2019-07-01 LAB — URINALYSIS, COMPLETE (UACMP) WITH MICROSCOPIC
Bilirubin Urine: NEGATIVE
Glucose, UA: NEGATIVE mg/dL
Ketones, ur: NEGATIVE mg/dL
Leukocytes,Ua: NEGATIVE
Nitrite: NEGATIVE
Protein, ur: NEGATIVE mg/dL
Specific Gravity, Urine: 1.004 — ABNORMAL LOW (ref 1.005–1.030)
pH: 6 (ref 5.0–8.0)

## 2019-07-01 LAB — CBC WITH DIFFERENTIAL/PLATELET
Abs Immature Granulocytes: 0.03 10*3/uL (ref 0.00–0.07)
Basophils Absolute: 0.1 10*3/uL (ref 0.0–0.1)
Basophils Relative: 1 %
Eosinophils Absolute: 0.1 10*3/uL (ref 0.0–0.5)
Eosinophils Relative: 1 %
HCT: 41.3 % (ref 36.0–46.0)
Hemoglobin: 14.7 g/dL (ref 12.0–15.0)
Immature Granulocytes: 0 %
Lymphocytes Relative: 28 %
Lymphs Abs: 2.6 10*3/uL (ref 0.7–4.0)
MCH: 37.4 pg — ABNORMAL HIGH (ref 26.0–34.0)
MCHC: 35.6 g/dL (ref 30.0–36.0)
MCV: 105.1 fL — ABNORMAL HIGH (ref 80.0–100.0)
Monocytes Absolute: 0.7 10*3/uL (ref 0.1–1.0)
Monocytes Relative: 7 %
Neutro Abs: 5.8 10*3/uL (ref 1.7–7.7)
Neutrophils Relative %: 63 %
Platelets: 205 10*3/uL (ref 150–400)
RBC: 3.93 MIL/uL (ref 3.87–5.11)
RDW: 12.8 % (ref 11.5–15.5)
WBC: 9.3 10*3/uL (ref 4.0–10.5)
nRBC: 0 % (ref 0.0–0.2)

## 2019-07-01 LAB — COMPREHENSIVE METABOLIC PANEL
ALT: 14 U/L (ref 0–44)
AST: 24 U/L (ref 15–41)
Albumin: 3.8 g/dL (ref 3.5–5.0)
Alkaline Phosphatase: 85 U/L (ref 38–126)
Anion gap: 11 (ref 5–15)
BUN: 7 mg/dL (ref 6–20)
CO2: 25 mmol/L (ref 22–32)
Calcium: 8.8 mg/dL — ABNORMAL LOW (ref 8.9–10.3)
Chloride: 100 mmol/L (ref 98–111)
Creatinine, Ser: 0.57 mg/dL (ref 0.44–1.00)
GFR calc Af Amer: >60 mL/min (ref >60)
GFR calc non Af Amer: >60 mL/min (ref >60)
Glucose, Bld: 120 mg/dL — ABNORMAL HIGH (ref 70–99)
Potassium: 3.5 mmol/L (ref 3.5–5.1)
Sodium: 136 mmol/L (ref 135–145)
Total Bilirubin: 0.9 mg/dL (ref 0.3–1.2)
Total Protein: 6.9 g/dL (ref 6.5–8.1)

## 2019-07-01 LAB — TROPONIN I (HIGH SENSITIVITY): Troponin I (High Sensitivity): <2 ng/L (ref <18)

## 2019-07-01 MED ORDER — PROMETHAZINE HCL 25 MG/ML IJ SOLN
25.0000 mg | Freq: Once | INTRAMUSCULAR | Status: AC
  Administered 2019-07-01: 25 mg via INTRAVENOUS
  Filled 2019-07-01: qty 1

## 2019-07-01 MED ORDER — KETOROLAC TROMETHAMINE 30 MG/ML IJ SOLN
30.0000 mg | Freq: Once | INTRAMUSCULAR | Status: AC
  Administered 2019-07-01: 30 mg via INTRAVENOUS
  Filled 2019-07-01: qty 1

## 2019-07-01 MED ORDER — SODIUM CHLORIDE 0.9 % IV BOLUS
1000.0000 mL | Freq: Once | INTRAVENOUS | Status: AC
  Administered 2019-07-01: 1000 mL via INTRAVENOUS

## 2019-07-01 MED ORDER — DIPHENHYDRAMINE HCL 50 MG/ML IJ SOLN
50.0000 mg | Freq: Once | INTRAMUSCULAR | Status: AC
  Administered 2019-07-01: 50 mg via INTRAVENOUS
  Filled 2019-07-01: qty 1

## 2019-07-01 NOTE — ED Notes (Signed)
See triage note. Pt states she started feeling a severe headache and body tremors and generalized numbness starting at around 1700 this afternoon. She states she feels like she is losing control of her body. Sx have become progressively worse since onset. Pt reports smoking marijuana to calm the headache and the tremors began shortly afterwards. Pt claims Hx of migranes since she was in fourth grade but this is the worst ever. Pt A&Ox4, no respiratory Sx evident.

## 2019-07-01 NOTE — ED Provider Notes (Signed)
[facility] Emergency Department Provider Note  ____________________________________________  Time seen: Approximately 8:27 PM  I have reviewed the triage vital signs and the nursing notes.   HISTORY  Chief Complaint Migraine    HPI Bethany Castaneda is a 38 y.o. female who presents the emergency department complaining of a near syncopal feeling.  Patient reports that over the past 3 days she has had a typical migraine headache.  No worsening or significant changes from her typical migraine.  Patient reports that she presents the emergency department tonight because she had a near syncopal episode while at home.  Patient states that she was preparing dinner when she started becoming very lightheaded, felt like she was going to pass out and then she became "very shaky."  Patient reports that she thought she was "losing control of my body."  Patient did not actually have a syncopal episode.  She denies any chest pain, shortness of breath, abdominal pain.  No dysuria, polyuria, hematuria.  Patient states that with her migraine she has still been managing to eat and drink appropriately.  Patient has a history of migraines, anxiety.          Past Medical History:  Diagnosis Date  . Anxiety   . Migraine   . Thyroid disease     Patient Active Problem List   Diagnosis Date Noted  . 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 HERNIA REPAIR    . OTHER SURGICAL HISTORY     vulvar cyst removal  . WISDOM TOOTH EXTRACTION      Prior to Admission medications   Medication Sig Start Date End Date Taking? Authorizing Provider  cyclobenzaprine (FLEXERIL) 5 MG tablet Take 1-2 tablets 3 times daily as needed 01/01/18   Laban Emperor, PA-C  HYDROcodone-acetaminophen (NORCO) 5-325 MG tablet Take 1 tablet by  mouth every 6 (six) hours as needed for moderate pain. 01/31/17   Will Bonnet, MD  ibuprofen (ADVIL,MOTRIN) 600 MG tablet Take 1 tablet (600 mg total) by mouth every 6 (six) hours as needed. 01/01/18   Laban Emperor, PA-C  levonorgestrel (MIRENA) 20 MCG/24HR IUD 1 Intra Uterine Device (1 each total) by Intrauterine route once. 02/28/17 02/28/17  Will Bonnet, MD    Allergies Patient has no known allergies.  Family History  Problem Relation Age of Onset  . Leukemia Maternal Grandmother     Social History Social History   Tobacco Use  . Smoking status: Current Every Day Smoker    Packs/day: 1.00    Types: Cigarettes  . Smokeless tobacco: Never Used  Substance Use Topics  . Alcohol use: Yes  . Drug use: No     Review of Systems  Constitutional: No fever/chills Eyes: No visual changes. No discharge ENT: No upper respiratory complaints. Cardiovascular: no chest pain. Respiratory: no cough. No SOB. Gastrointestinal: No abdominal pain.  No nausea, no vomiting.  No diarrhea.  No constipation. Genitourinary: Negative for dysuria. No hematuria Musculoskeletal: Negative for musculoskeletal pain. Skin: Negative for rash, abrasions, lacerations, ecchymosis. Neurological: Positive for migraine headache.  Positive for near syncopal episode.  Denies focal weakness or numbness. 10-point ROS otherwise negative.  ____________________________________________   PHYSICAL EXAM:  VITAL SIGNS: ED Triage Vitals [07/01/19 2008]  Enc Vitals Group     BP 118/87     Pulse Rate 100  Resp 18     Temp 98.5 F (36.9 C)     Temp src      SpO2 99 %     Weight 154 lb (69.9 kg)     Height 5\' 7"  (1.702 m)     Head Circumference      Peak Flow      Pain Score 10     Pain Loc      Pain Edu?      Excl. in GC?      Constitutional: Alert and oriented. Well appearing and in no acute distress. Eyes: Conjunctivae are normal. PERRL. EOMI. Head: Atraumatic. ENT:      Ears:        Nose: No congestion/rhinnorhea.      Mouth/Throat: Mucous membranes are moist.  Neck: No stridor.  Neck is supple full range of motion Hematological/Lymphatic/Immunilogical: No cervical lymphadenopathy. Cardiovascular: Normal rate, regular rhythm. Normal S1 and S2.  Good peripheral circulation. Respiratory: Normal respiratory effort without tachypnea or retractions. Lungs CTAB. Good air entry to the bases with no decreased or absent breath sounds. Gastrointestinal: Bowel sounds 4 quadrants. Soft and nontender to palpation. No guarding or rigidity. No palpable masses. No distention. No CVA tenderness. Musculoskeletal: Full range of motion to all extremities. No gross deformities appreciated. Neurologic:  Normal speech and language. No gross focal neurologic deficits are appreciated.  Cranial nerves II through XII grossly intact.  Negative Romberg's and pronator drift. Skin:  Skin is warm, dry and intact. No rash noted. Psychiatric: Mood and affect are normal. Speech and behavior are normal. Patient exhibits appropriate insight and judgement.   ____________________________________________   LABS (all labs ordered are listed, but only abnormal results are displayed)  Labs Reviewed  COMPREHENSIVE METABOLIC PANEL - Abnormal; Notable for the following components:      Result Value   Glucose, Bld 120 (*)    Calcium 8.8 (*)    All other components within normal limits  CBC WITH DIFFERENTIAL/PLATELET - Abnormal; Notable for the following components:   MCV 105.1 (*)    MCH 37.4 (*)    All other components within normal limits  URINALYSIS, COMPLETE (UACMP) WITH MICROSCOPIC - Abnormal; Notable for the following components:   Color, Urine YELLOW (*)    APPearance CLEAR (*)    Specific Gravity, Urine 1.004 (*)    Hgb urine dipstick SMALL (*)    Bacteria, UA RARE (*)    All other components within normal limits  TROPONIN I (HIGH SENSITIVITY)  TROPONIN I (HIGH SENSITIVITY)    ____________________________________________  EKG  ED ECG REPORT I, Cuthriell,  personally viewed and interpreted this ECG.   Date: 07/01/2019  EKG Time: 2109 hrs.  Rate: 82 bpm  Rhythm: normal EKG, normal sinus rhythm, there are no previous tracings available for comparison  Axis: Normal axis  Intervals:none  ST&T Change: No ST elevation or depression noted  Normal EKG.  No STEMI.  ____________________________________________  RADIOLOGY   No results found.  ____________________________________________    PROCEDURES  Procedure(s) performed:    Procedures    Medications  promethazine (PHENERGAN) injection 25 mg (has no administration in time range)  sodium chloride 0.9 % bolus 1,000 mL (1,000 mLs Intravenous New Bag/Given 07/01/19 2113)  ketorolac (TORADOL) 30 MG/ML injection 30 mg (30 mg Intravenous Given 07/01/19 2205)  diphenhydrAMINE (BENADRYL) injection 50 mg (50 mg Intravenous Given 07/01/19 2204)     ____________________________________________   INITIAL IMPRESSION / ASSESSMENT AND PLAN / ED COURSE  Pertinent labs & imaging results that were available during my care of the patient were reviewed by me and considered in my medical decision making (see chart for details).  Review of the [place] CSRS was performed in accordance of the [facility] prior to dispensing any controlled drugs.  Clinical Course as of Jun 30 2204  Mon Jul 01, 2019  2147 Patient presented to emergency department with 3 days of migraine headache that is a typical migraine.  Patient presents emergency department for evaluation as she had a near syncopal feeling at home.  Patient did not actually experience syncope.  Labs at this time are reassuring.  Patient has been receiving IV fluids and states that she feels much better after fluids.  Patient will receive migraine cocktail here in the emergency department and will be discharged home.   [JC]    Clinical Course User Index [JC]  Cuthriell, Delorise RoyalsJonathan D, PA-C          Patient's diagnosis is consistent with migraine headache.  Patient presented to the emergency department complaining of 3 days of migraine.  Migraine was typical without any unique features.  Patient did develop some presyncopal feeling today while cooking.  She presented to the emergency department for evaluation.  Labs and urinalysis are reassuring.  Patient states much improvement after IV fluids.  Patient is given migraine cocktail for her migraine symptoms.  Follow-up with primary care as needed..  Patient is given ED precautions to return to the ED for any worsening or new symptoms.     ____________________________________________  FINAL CLINICAL IMPRESSION(S) / ED DIAGNOSES  Final diagnoses:  Chronic migraine without aura without status migrainosus, not intractable      NEW MEDICATIONS STARTED DURING THIS VISIT:  ED Discharge Orders    None          This chart was dictated using voice recognition software/Dragon. Despite best efforts to proofread, errors can occur which can change the meaning. Any change was purely unintentional.    Racheal PatchesCuthriell, Jonathan D, PA-C 07/01/19 2206    Arnaldo NatalMalinda, Paul F, MD 07/01/19 2314

## 2019-07-01 NOTE — ED Triage Notes (Signed)
Pt comes via POV from home with c/o migraine. Pt states this started about 3 days ago.  Pt states 10/10 pain. Pt states some nausea.

## 2019-10-20 IMAGING — CR DG RIBS 2V*L*
2 series · 2 of 2 positions shown · non-contrast
Comparison: 01/01/2018

CLINICAL DATA: Fall.  Left rib pain

EXAM:
LEFT RIBS - 2 VIEW

[rib ap]
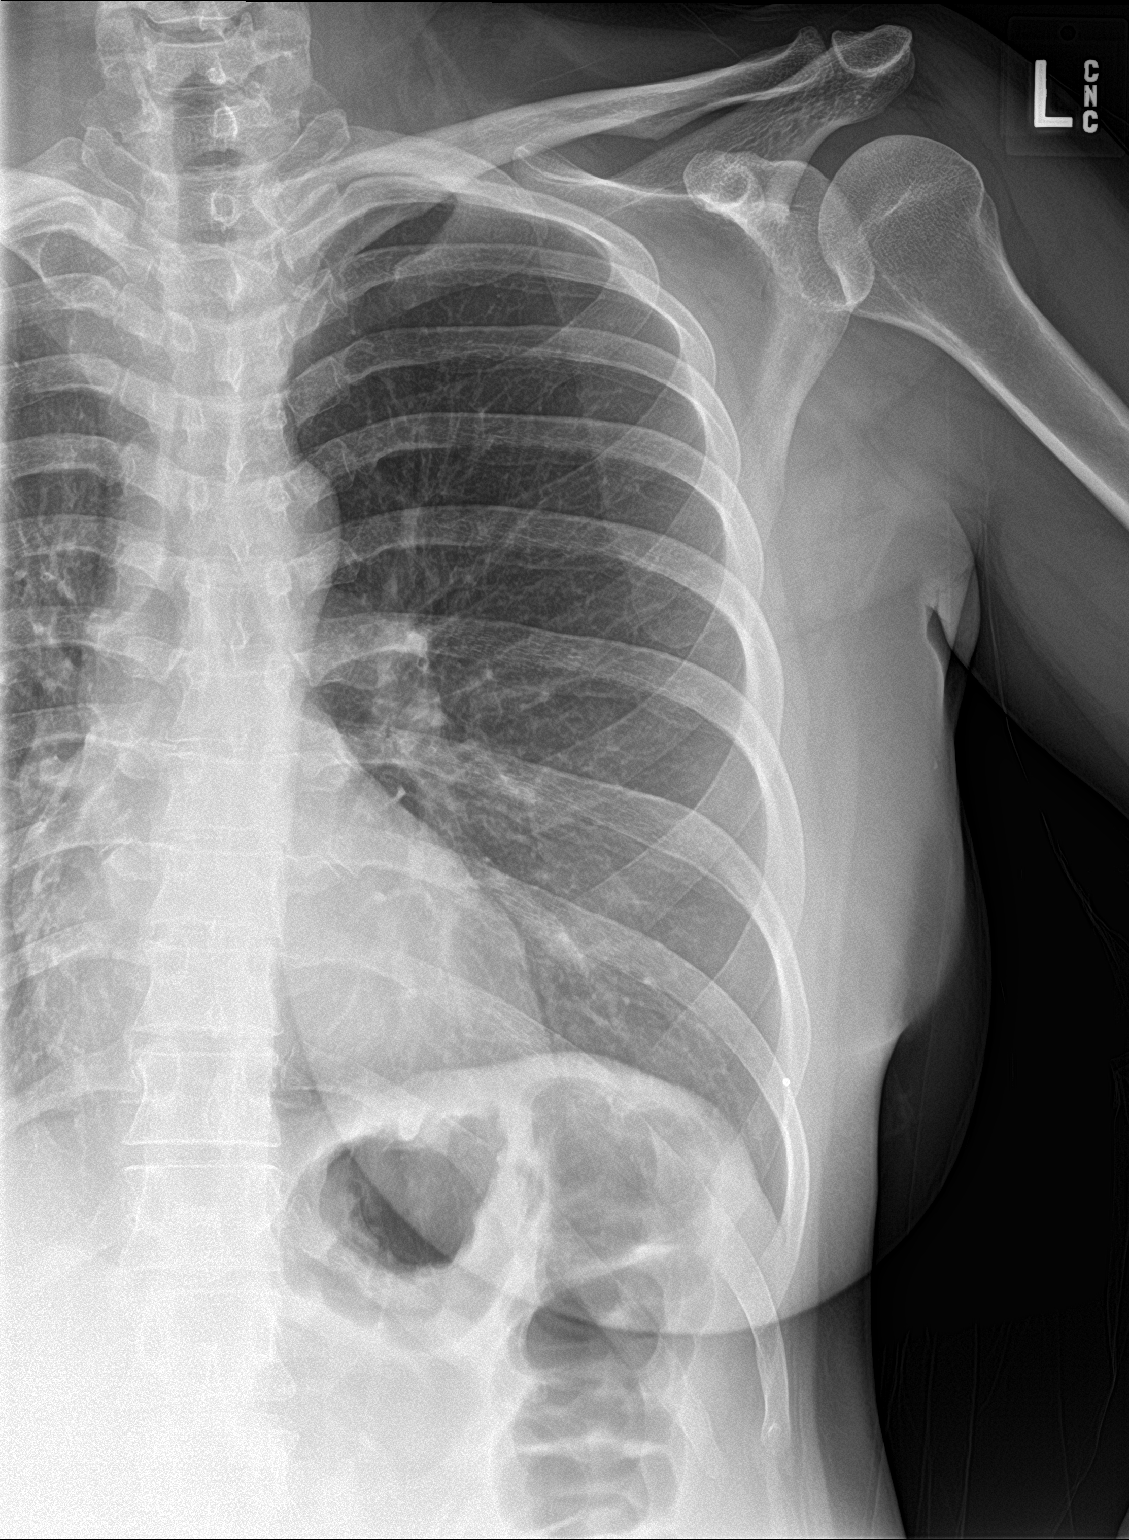

[rib ap obl]
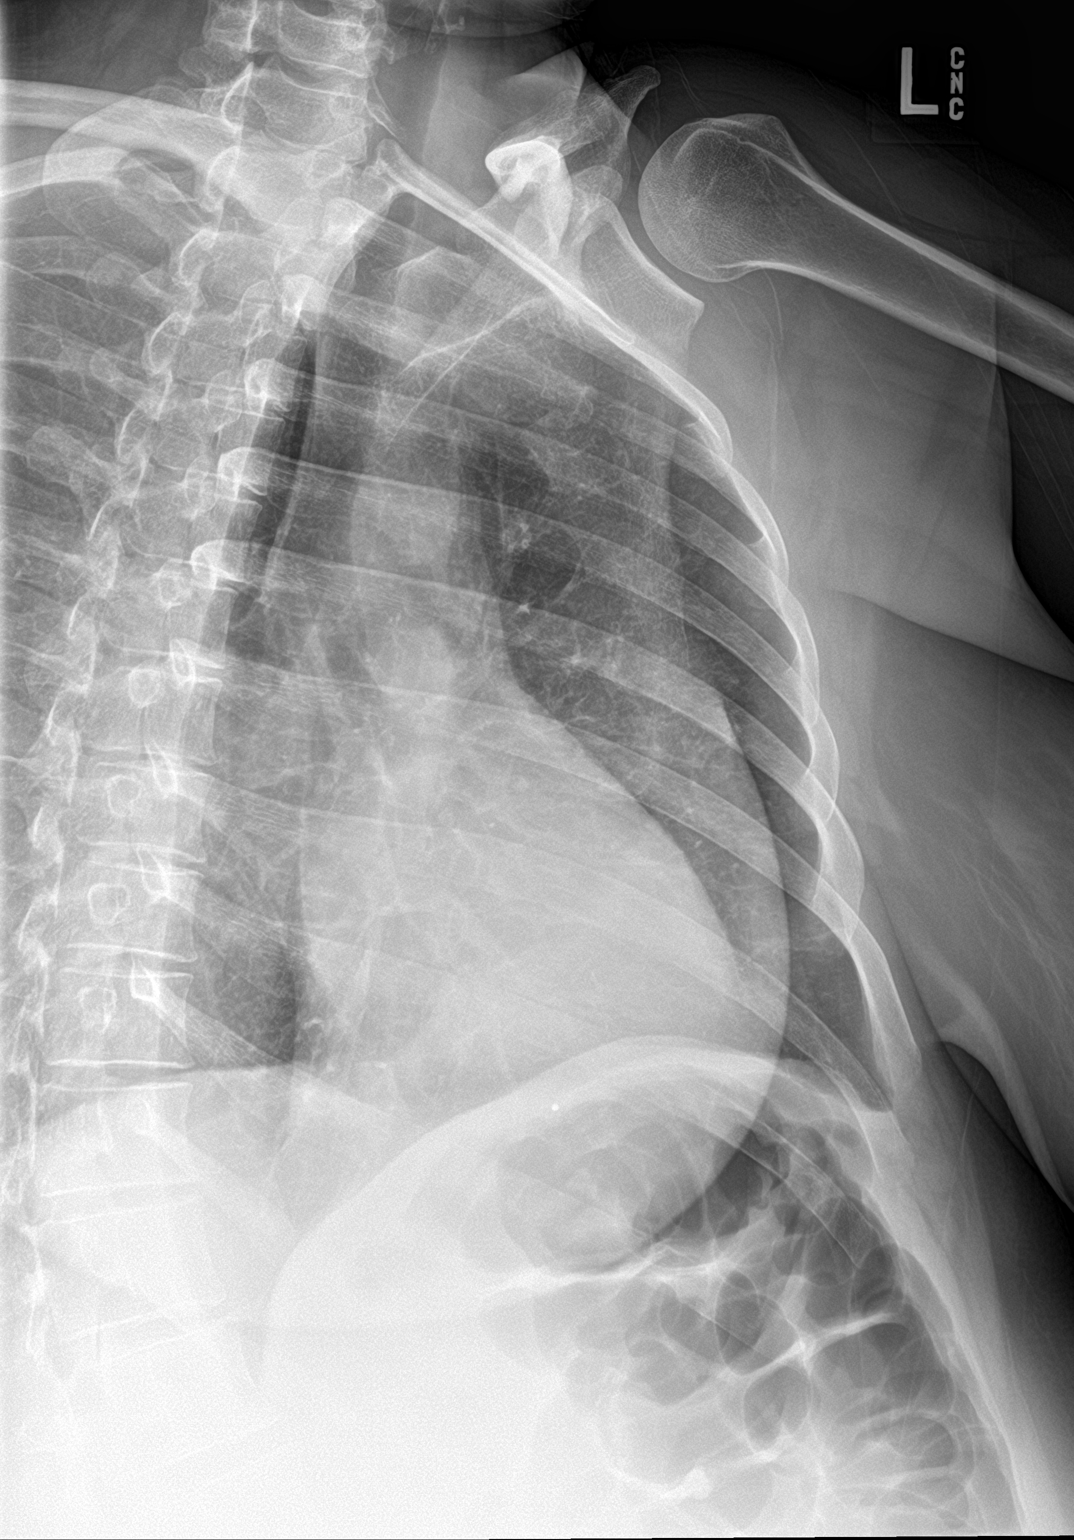

[2 of 2 positions shown; findings below may reference images not displayed]

FINDINGS: No fracture or other bone lesions are seen involving the ribs.
Visualized left lung clear. No effusion or pneumothorax.
IMPRESSION: Negative.

## 2019-12-15 ENCOUNTER — Other Ambulatory Visit: Payer: Self-pay

## 2019-12-15 ENCOUNTER — Emergency Department
Admission: EM | Admit: 2019-12-15 | Discharge: 2019-12-16 | Disposition: A | Discharge status: Home / Self Care | Payer: Medicaid Other | Attending: Emergency Medicine | Admitting: Emergency Medicine

## 2019-12-15 DIAGNOSIS — F17210 Nicotine dependence, cigarettes, uncomplicated: Secondary | ICD-10-CM | POA: Insufficient documentation

## 2019-12-15 DIAGNOSIS — M5416 Radiculopathy, lumbar region: Secondary | ICD-10-CM | POA: Insufficient documentation

## 2019-12-15 DIAGNOSIS — M549 Dorsalgia, unspecified: Secondary | ICD-10-CM

## 2019-12-15 DIAGNOSIS — R519 Headache, unspecified: Secondary | ICD-10-CM | POA: Insufficient documentation

## 2019-12-15 DIAGNOSIS — G8929 Other chronic pain: Secondary | ICD-10-CM

## 2019-12-15 DIAGNOSIS — M545 Low back pain: Secondary | ICD-10-CM | POA: Diagnosis present

## 2019-12-15 HISTORY — DX: Dorsalgia, unspecified: M54.9

## 2019-12-15 HISTORY — DX: Other chronic pain: G89.29

## 2019-12-15 LAB — CBC
HCT: 41.3 % (ref 36.0–46.0)
Hemoglobin: 15 g/dL (ref 12.0–15.0)
MCH: 36.1 pg — ABNORMAL HIGH (ref 26.0–34.0)
MCHC: 36.3 g/dL — ABNORMAL HIGH (ref 30.0–36.0)
MCV: 99.5 fL (ref 80.0–100.0)
Platelets: 261 10*3/uL (ref 150–400)
RBC: 4.15 MIL/uL (ref 3.87–5.11)
RDW: 12.2 % (ref 11.5–15.5)
WBC: 14 10*3/uL — ABNORMAL HIGH (ref 4.0–10.5)
nRBC: 0 % (ref 0.0–0.2)

## 2019-12-15 LAB — PREGNANCY, URINE: Preg Test, Ur: NEGATIVE

## 2019-12-15 LAB — BASIC METABOLIC PANEL
Anion gap: 10 (ref 5–15)
BUN: 5 mg/dL — ABNORMAL LOW (ref 6–20)
CO2: 23 mmol/L (ref 22–32)
Calcium: 8.9 mg/dL (ref 8.9–10.3)
Chloride: 100 mmol/L (ref 98–111)
Creatinine, Ser: 0.6 mg/dL (ref 0.44–1.00)
GFR calc Af Amer: >60 mL/min (ref >60)
GFR calc non Af Amer: >60 mL/min (ref >60)
Glucose, Bld: 107 mg/dL — ABNORMAL HIGH (ref 70–99)
Potassium: 3.4 mmol/L — ABNORMAL LOW (ref 3.5–5.1)
Sodium: 133 mmol/L — ABNORMAL LOW (ref 135–145)

## 2019-12-15 LAB — URINALYSIS, COMPLETE (UACMP) WITH MICROSCOPIC
Bilirubin Urine: NEGATIVE
Glucose, UA: NEGATIVE mg/dL
Ketones, ur: NEGATIVE mg/dL
Leukocytes,Ua: NEGATIVE
Nitrite: NEGATIVE
Protein, ur: NEGATIVE mg/dL
Specific Gravity, Urine: 1.009 (ref 1.005–1.030)
pH: 6 (ref 5.0–8.0)

## 2019-12-15 MED ORDER — LIDOCAINE 5 % EX PTCH: 1.0000 | MEDICATED_PATCH | Freq: Two times a day (BID) | CUTANEOUS | 0 refills | Status: DC

## 2019-12-15 MED ORDER — CYCLOBENZAPRINE HCL 5 MG PO TABS: 5.0000 mg | ORAL_TABLET | Freq: Three times a day (TID) | ORAL | 0 refills | Status: DC | PRN

## 2019-12-15 MED ORDER — KETOROLAC TROMETHAMINE 30 MG/ML IJ SOLN
30.0000 mg | Freq: Once | INTRAMUSCULAR | Status: AC
  Administered 2019-12-16: 30 mg via INTRAMUSCULAR
  Filled 2019-12-15: qty 1

## 2019-12-15 MED ORDER — LIDOCAINE 5 % EX PTCH
1.0000 | MEDICATED_PATCH | CUTANEOUS | Status: DC
  Administered 2019-12-16: 1 via TRANSDERMAL
  Filled 2019-12-15: qty 1

## 2019-12-15 MED ORDER — POTASSIUM CHLORIDE CRYS ER 20 MEQ PO TBCR
20.0000 meq | EXTENDED_RELEASE_TABLET | Freq: Once | ORAL | Status: AC
  Administered 2019-12-16: 20 meq via ORAL
  Filled 2019-12-15: qty 1

## 2019-12-15 NOTE — ED Notes (Signed)
Pt is here due to feeling unwell for 3 days.  Pt has been having HA  With pain running down her neck (neck subtle) and down her right back and down into her right leg and all the way to her foot.  Pt describes the pain as severe.

## 2019-12-15 NOTE — ED Triage Notes (Signed)
Patient arrived via POV from home. Patient is AOx4 and ambulatory. Patient chief complaint is headache that began 3x days ago with pain in lower back as well. Patient states lower back pain is what caused the headache. Patient denies unilateral weakness, blurry vision.

## 2019-12-15 NOTE — ED Provider Notes (Signed)
[facility] Emergency Department Provider Note   ____________________________________________   First MD Initiated Contact with Patient 12/15/19 2148     (approximate)  I have reviewed the triage vital signs and the nursing notes.   HISTORY  Chief Complaint Headache and Back Pain    HPI Bethany Castaneda is a 39 y.o. female with past medical history of back pain and migraines presents to the ED complaining of back pain.  Patient reports that she has had a few days of worsening pain to her right lower back that seems to shoot down to her right leg.  She also states that the pain is bad enough that it seems to be causing a headache.  It has been difficult for her to fully extend her right leg due to pain, but she denies any focal numbness or weakness.  She has dealt with similar issues affecting her back in the past, but symptoms have now seem to flareup over the past couple of days.  She has been taking Tylenol and ibuprofen over-the-counter without significant relief.  She denies any dysuria, hematuria, fevers, or flank pain.  She has not had any urinary retention or incontinence and denies any saddle anesthesia.        Past Medical History:  Diagnosis Date  . Anxiety   . Chronic back pain 12/15/2019  . Migraine   . Thyroid disease     Patient Active Problem List   Diagnosis Date Noted  . 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 HERNIA REPAIR    . OTHER SURGICAL HISTORY     vulvar cyst removal  . WISDOM TOOTH EXTRACTION      Prior to Admission medications   Medication Sig Start Date End Date Taking? Authorizing Provider  cyclobenzaprine (FLEXERIL) 5 MG tablet Take 1 tablet (5 mg total) by mouth 3 (three) times daily as needed for muscle spasms. 12/15/19   Chesley Noon, MD    HYDROcodone-acetaminophen (NORCO) 5-325 MG tablet Take 1 tablet by mouth every 6 (six) hours as needed for moderate pain. 01/31/17   Conard Novak, MD  ibuprofen (ADVIL,MOTRIN) 600 MG tablet Take 1 tablet (600 mg total) by mouth every 6 (six) hours as needed. 01/01/18   Enid Derry, PA-C  levonorgestrel (MIRENA) 20 MCG/24HR IUD 1 Intra Uterine Device (1 each total) by Intrauterine route once. 02/28/17 02/28/17  Conard Novak, MD  lidocaine (LIDODERM) 5 % Place 1 patch onto the skin every 12 (twelve) hours. Remove & Discard patch within 12 hours or as directed by MD 12/15/19 12/14/20  Chesley Noon, MD    Allergies Patient has no known allergies.  Family History  Problem Relation Age of Onset  . Leukemia Maternal Grandmother     Social History Social History   Tobacco Use  . Smoking status: Current Every Day Smoker    Packs/day: 1.00    Types: Cigarettes  . Smokeless tobacco: Never Used  Substance Use Topics  . Alcohol use: Yes    Comment: social drinker / ocasional  . Drug use: No    Review of Systems  Constitutional: No fever/chills Eyes: No visual changes. ENT: No sore throat. Cardiovascular: Denies chest pain. Respiratory: Denies shortness of breath. Gastrointestinal: No abdominal pain.  No nausea, no vomiting.  No diarrhea.  No constipation. Genitourinary: Negative for dysuria. Musculoskeletal: Positive  for back pain. Skin: Negative for rash. Neurological: Negative for headaches, focal weakness or numbness.  ____________________________________________   PHYSICAL EXAM:  VITAL SIGNS: ED Triage Vitals  Enc Vitals Group     BP 12/15/19 1936 (!) 143/78     Pulse Rate 12/15/19 1936 (!) 102     Resp 12/15/19 1936 (!) 24     Temp 12/15/19 1936 98.2 F (36.8 C)     Temp Source 12/15/19 1936 Oral     SpO2 12/15/19 1936 98 %     Weight 12/15/19 1936 160 lb (72.6 kg)     Height 12/15/19 1936 5\' 6"  (1.676 m)     Head Circumference --      Peak Flow --       Pain Score 12/15/19 1951 10     Pain Loc --      Pain Edu? --      Excl. in [place]? --     Constitutional: Alert and oriented. Eyes: Conjunctivae are normal. Head: Atraumatic. Nose: No congestion/rhinnorhea. Mouth/Throat: Mucous membranes are moist. Neck: Normal ROM Cardiovascular: Normal rate, regular rhythm. Grossly normal heart sounds.  2+ DP pulses bilaterally. Respiratory: Normal respiratory effort.  No retractions. Lungs CTAB. Gastrointestinal: Soft and nontender. No distention. Genitourinary: deferred Musculoskeletal: No lower extremity tenderness nor edema.  Tenderness to palpation over right lumbar paraspinal area. Neurologic:  Normal speech and language. No gross focal neurologic deficits are appreciated. Skin:  Skin is warm, dry and intact. No rash noted. Psychiatric: Mood and affect are normal. Speech and behavior are normal.  ____________________________________________   LABS (all labs ordered are listed, but only abnormal results are displayed)  Labs Reviewed  BASIC METABOLIC PANEL - Abnormal; Notable for the following components:      Result Value   Sodium 133 (*)    Potassium 3.4 (*)    Glucose, Bld 107 (*)    BUN 5 (*)    All other components within normal limits  CBC - Abnormal; Notable for the following components:   WBC 14.0 (*)    MCH 36.1 (*)    MCHC 36.3 (*)    All other components within normal limits  URINALYSIS, COMPLETE (UACMP) WITH MICROSCOPIC - Abnormal; Notable for the following components:   Color, Urine YELLOW (*)    APPearance CLEAR (*)    Hgb urine dipstick SMALL (*)    Bacteria, UA RARE (*)    All other components within normal limits  PREGNANCY, URINE    PROCEDURES  Procedure(s) performed (including Critical Care):  Procedures   ____________________________________________   INITIAL IMPRESSION / ASSESSMENT AND PLAN / ED COURSE       39 year old female presents to the ED complaining of pain originating from her right  lumbar region and shooting down her right leg, also causing some headache.  Doubt emergent etiology for her headache as her primary complaint appears to be back pain and she has a nonfocal neurologic exam.  No red flag findings regarding her back pain and I do not feel imaging is indicated at this time.  Symptoms appear most consistent with sciatica and she is neurovascularly intact to her bilateral lower extremities.  We will treat conservatively with NSAIDs, Lidoderm patch, and Flexeril.  Pregnancy testing is negative and UA without evidence of infection.  She is appropriate for discharge home and I have counseled her to follow-up with her PCP, otherwise return to the ED for new or worsening symptoms.  Patient agrees with plan.  ____________________________________________   FINAL CLINICAL IMPRESSION(S) / ED DIAGNOSES  Final diagnoses:  Lumbar radiculopathy     ED Discharge Orders         Ordered    cyclobenzaprine (FLEXERIL) 5 MG tablet  3 times daily PRN     12/15/19 2349    lidocaine (LIDODERM) 5 %  Every 12 hours     12/15/19 2349           Note:  This document was prepared using Dragon voice recognition software and may include unintentional dictation errors.   Blake Divine, MD 12/16/19 [phone]

## 2019-12-15 NOTE — ED Notes (Signed)
Call to lab to alert them that urine pregnancy was added to urine already in lab

## 2020-02-14 ENCOUNTER — Other Ambulatory Visit: Payer: Self-pay

## 2020-02-14 ENCOUNTER — Emergency Department: Payer: Medicaid Other

## 2020-02-14 ENCOUNTER — Emergency Department
Admission: EM | Admit: 2020-02-14 | Discharge: 2020-02-14 | Disposition: A | Discharge status: Home / Self Care | Payer: Medicaid Other | Attending: Emergency Medicine | Admitting: Emergency Medicine

## 2020-02-14 DIAGNOSIS — R52 Pain, unspecified: Secondary | ICD-10-CM

## 2020-02-14 DIAGNOSIS — F17210 Nicotine dependence, cigarettes, uncomplicated: Secondary | ICD-10-CM | POA: Insufficient documentation

## 2020-02-14 DIAGNOSIS — M79672 Pain in left foot: Secondary | ICD-10-CM | POA: Diagnosis present

## 2020-02-14 DIAGNOSIS — M109 Gout, unspecified: Secondary | ICD-10-CM | POA: Diagnosis not present

## 2020-02-14 DIAGNOSIS — E079 Disorder of thyroid, unspecified: Secondary | ICD-10-CM | POA: Diagnosis not present

## 2020-02-14 LAB — URIC ACID: Uric Acid, Serum: 9.3 mg/dL — ABNORMAL HIGH (ref 2.5–7.1)

## 2020-02-14 MED ORDER — KETOROLAC TROMETHAMINE 30 MG/ML IJ SOLN
30.0000 mg | Freq: Once | INTRAMUSCULAR | Status: AC
  Administered 2020-02-14: 30 mg via INTRAMUSCULAR
  Filled 2020-02-14: qty 1

## 2020-02-14 MED ORDER — TRAMADOL HCL 50 MG PO TABS: 50.0000 mg | ORAL_TABLET | Freq: Four times a day (QID) | ORAL | 0 refills | Status: AC | PRN

## 2020-02-14 MED ORDER — COLCHICINE 0.6 MG PO TABS: ORAL_TABLET | ORAL | 0 refills | Status: AC

## 2020-02-14 NOTE — ED Notes (Signed)
See triage note  Presents with left foot pain   States pain started about 2-3 days ago  Denies any known injury  States pain radiates from heel into lateral foot  Unable to bear full wt

## 2020-02-14 NOTE — Discharge Instructions (Signed)
Take 2 tablets of colchicine on the first day.  Wait 1 hour and take 1 more tablet.  After the first day, you can take 1 tablet daily until gout flare resolves. You have also been prescribed tramadol for pain. Keep in mind that red meat, smoking and alcohol can increase your risk of having another flare.

## 2020-02-14 NOTE — ED Provider Notes (Signed)
Emergency Department Provider Note  ____________________________________________  Time seen: Approximately 9:05 PM  I have reviewed the triage vital signs and the nursing notes.   HISTORY  Chief Complaint Foot Pain   Historian Patient     HPI Bethany Castaneda is a 39 y.o. female presents to the emergency department with pain and erythema along the dorsal aspect of the left foot that started 2 to 3 days ago.  Patient reports a diet that is high in red meat.  She is a daily smoker and drinks daily alcohol.  Patient states that pain is severe and the dorsal aspect of the foot is very sensitive to touch.  She denies falls or mechanisms of trauma.  She has noticed some erythema along the distal aspect of the first metatarsal.  No numbness or tingling.  No other alleviating measures have been attempted   Past Medical History:  Diagnosis Date  . Anxiety   . Chronic back pain 12/15/2019  . Migraine   . Thyroid disease      Immunizations up to date:  Yes.     Past Medical History:  Diagnosis Date  . Anxiety   . Chronic back pain 12/15/2019  . Migraine   . Thyroid disease     Patient Active Problem List   Diagnosis Date Noted  . 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 HERNIA REPAIR    . OTHER SURGICAL HISTORY     vulvar cyst removal  . WISDOM TOOTH EXTRACTION      Prior to Admission medications   Medication Sig Start Date End Date Taking? Authorizing Provider  colchicine 0.6 MG tablet On the first day, take 2 tablets and then 1 hour later, take 1 more tablet.  After the first day, take 1 tablet until flare resolves. 02/14/20   Orvil Feil, PA-C  levonorgestrel (MIRENA) 20 MCG/24HR IUD 1 Intra Uterine Device (1 each total) by Intrauterine route once. 02/28/17 02/28/17  Conard Novak, MD   traMADol (ULTRAM) 50 MG tablet Take 1 tablet (50 mg total) by mouth every 6 (six) hours as needed for up to 3 days. 02/14/20 02/17/20  Orvil Feil, PA-C    Allergies Patient has no known allergies.  Family History  Problem Relation Age of Onset  . Leukemia Maternal Grandmother     Social History Social History   Tobacco Use  . Smoking status: Current Every Day Smoker    Packs/day: 1.00    Types: Cigarettes  . Smokeless tobacco: Never Used  Vaping Use  . Vaping Use: Never used  Substance Use Topics  . Alcohol use: Yes    Comment: social drinker / ocasional  . Drug use: No     Review of Systems  Constitutional: No fever/chills Eyes:  No discharge ENT: No upper respiratory complaints. Respiratory: no cough. No SOB/ use of accessory muscles to breath Gastrointestinal:   No nausea, no vomiting.  No diarrhea.  No constipation. Musculoskeletal: Patient has left foot pain.  Skin: Negative for rash, abrasions, lacerations, ecchymosis.    ____________________________________________   PHYSICAL EXAM:  VITAL SIGNS: ED Triage Vitals  Enc Vitals Group     BP 02/14/20 1810 (!) 141/80     Pulse Rate 02/14/20 1810 98     Resp 02/14/20 1810 20     Temp 02/14/20 1810 98.7 F (37.1  C)     Temp Source 02/14/20 1810 Oral     SpO2 02/14/20 1810 100 %     Weight 02/14/20 1810 162 lb (73.5 kg)     Height 02/14/20 1810 5\' 7"  (1.702 m)     Head Circumference --      Peak Flow --      Pain Score 02/14/20 1841 10     Pain Loc --      Pain Edu? --      Excl. in [place]? --      Constitutional: Alert and oriented. Well appearing and in no acute distress. Eyes: Conjunctivae are normal. PERRL. EOMI. Head: Atraumatic. Cardiovascular: Normal rate, regular rhythm. Normal S1 and S2.  Good peripheral circulation. Respiratory: Normal respiratory effort without tachypnea or retractions. Lungs CTAB. Good air entry to the bases with no decreased or absent breath sounds Gastrointestinal:  Bowel sounds x 4 quadrants. Soft and nontender to palpation. No guarding or rigidity. No distention. Musculoskeletal: Full range of motion to all extremities. No obvious deformities noted Neurologic:  Normal for age. No gross focal neurologic deficits are appreciated.  Skin: Patient has some mild erythema overlying the distal aspect of the first left metatarsal.  Palpable dorsalis pedis pulse, left.  No edema of the left foot.Marland Kitchen Psychiatric: Mood and affect are normal for age. Speech and behavior are normal.   ____________________________________________   LABS (all labs ordered are listed, but only abnormal results are displayed)  Labs Reviewed  URIC ACID - Abnormal; Notable for the following components:      Result Value   Uric Acid, Serum 9.3 (*)    All other components within normal limits   ____________________________________________  EKG   ____________________________________________  RADIOLOGY Unk Pinto, personally viewed and evaluated these images (plain radiographs) as part of my medical decision making, as well as reviewing the written report by the radiologist.  DG Foot Complete Left  Result Date: 02/14/2020 CLINICAL DATA:  Left foot pain.  No known injury. EXAM: LEFT FOOT - COMPLETE 3+ VIEW COMPARISON:  None. FINDINGS: There is no evidence of fracture or dislocation. The fibular sesamoid subjacent to the first metatarsal may be bipartite or have adjacent heterotopic ossification. Small well corticated osseous density adjacent to the second toe middle phalanx at the distal interphalangeal joint appears chronic and may be an accessory ossicle or sequela of remote prior injury. Normal alignment. Joint spaces are preserved. No erosion. Soft tissues are unremarkable. IMPRESSION: 1. No acute osseous abnormality. 2. 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 3. Small well corticated osseous density adjacent to the second toe middle phalanx appears chronic  and may be an accessory ossicle or sequela of remote prior injury. Electronically Signed   By: Keith Rake M.D.   On: 02/14/2020 19:12    ____________________________________________    PROCEDURES  Procedure(s) performed:     Procedures     Medications  ketorolac (TORADOL) 30 MG/ML injection 30 mg (has no administration in time range)     ____________________________________________   INITIAL IMPRESSION / ASSESSMENT AND PLAN / ED COURSE  Pertinent labs & imaging results that were available during my care of the patient were reviewed by me and considered in my medical decision making (see chart for details).      Assessment and plan Gout 39 year old female presents to the emergency department with acute left pain for the past 2 to 3 days.  Vital signs are reassuring at triage.  On physical exam, patient had a  very small region of erythema along the distal aspect of the left first metatarsal.  Uric acid level was elevated in the emergency department and I am suspicious for gout.  Patient was given an injection of Toradol in the emergency department.  She was discharged with colchicine and tramadol.  Return precautions were given to return with new or worsening symptoms.   ____________________________________________  FINAL CLINICAL IMPRESSION(S) / ED DIAGNOSES  Final diagnoses:  Pain  Acute gout of left foot, unspecified cause      NEW MEDICATIONS STARTED DURING THIS VISIT:  ED Discharge Orders         Ordered    colchicine 0.6 MG tablet     Discontinue  Reprint     02/14/20 2101    traMADol (ULTRAM) 50 MG tablet  Every 6 hours PRN     Discontinue  Reprint     02/14/20 2102              This chart was dictated using voice recognition software/Dragon. Despite best efforts to proofread, errors can occur which can change the meaning. Any change was purely unintentional.     Gasper Lloyd 02/14/20 2108    Phineas Semen, MD 02/14/20  [id number]

## 2020-02-14 NOTE — ED Triage Notes (Signed)
Pt states that she has been having left foot pain, states that she will have pain if she over uses it, states hx of fx 9 years ago. Pt denies hx of gout but states the slightest movement or touch is painful, skin is warm and dry, pedal pulse palpated, cap refill less than 3 sec

## 2021-12-02 IMAGING — DX DG FOOT COMPLETE 3+V*L*
3 series · 3 of 3 positions shown · non-contrast
Comparison: None.

CLINICAL DATA: Left foot pain.  No known injury.

EXAM:
LEFT FOOT - COMPLETE 3+ VIEW

[foot ap]
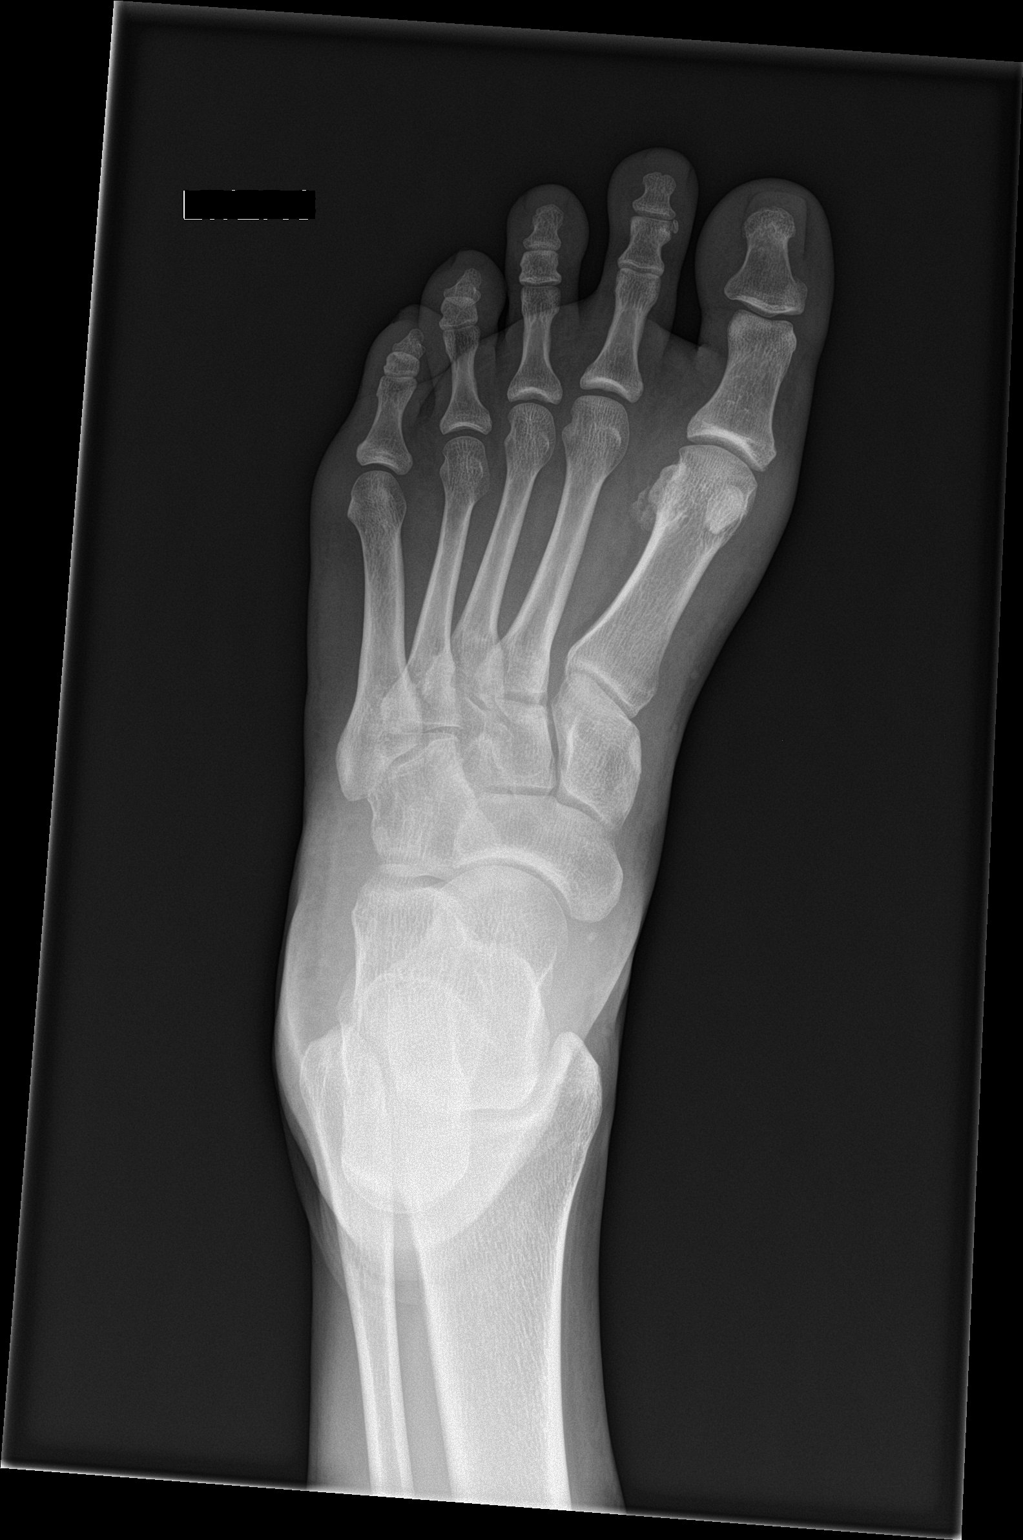

[foot obl]
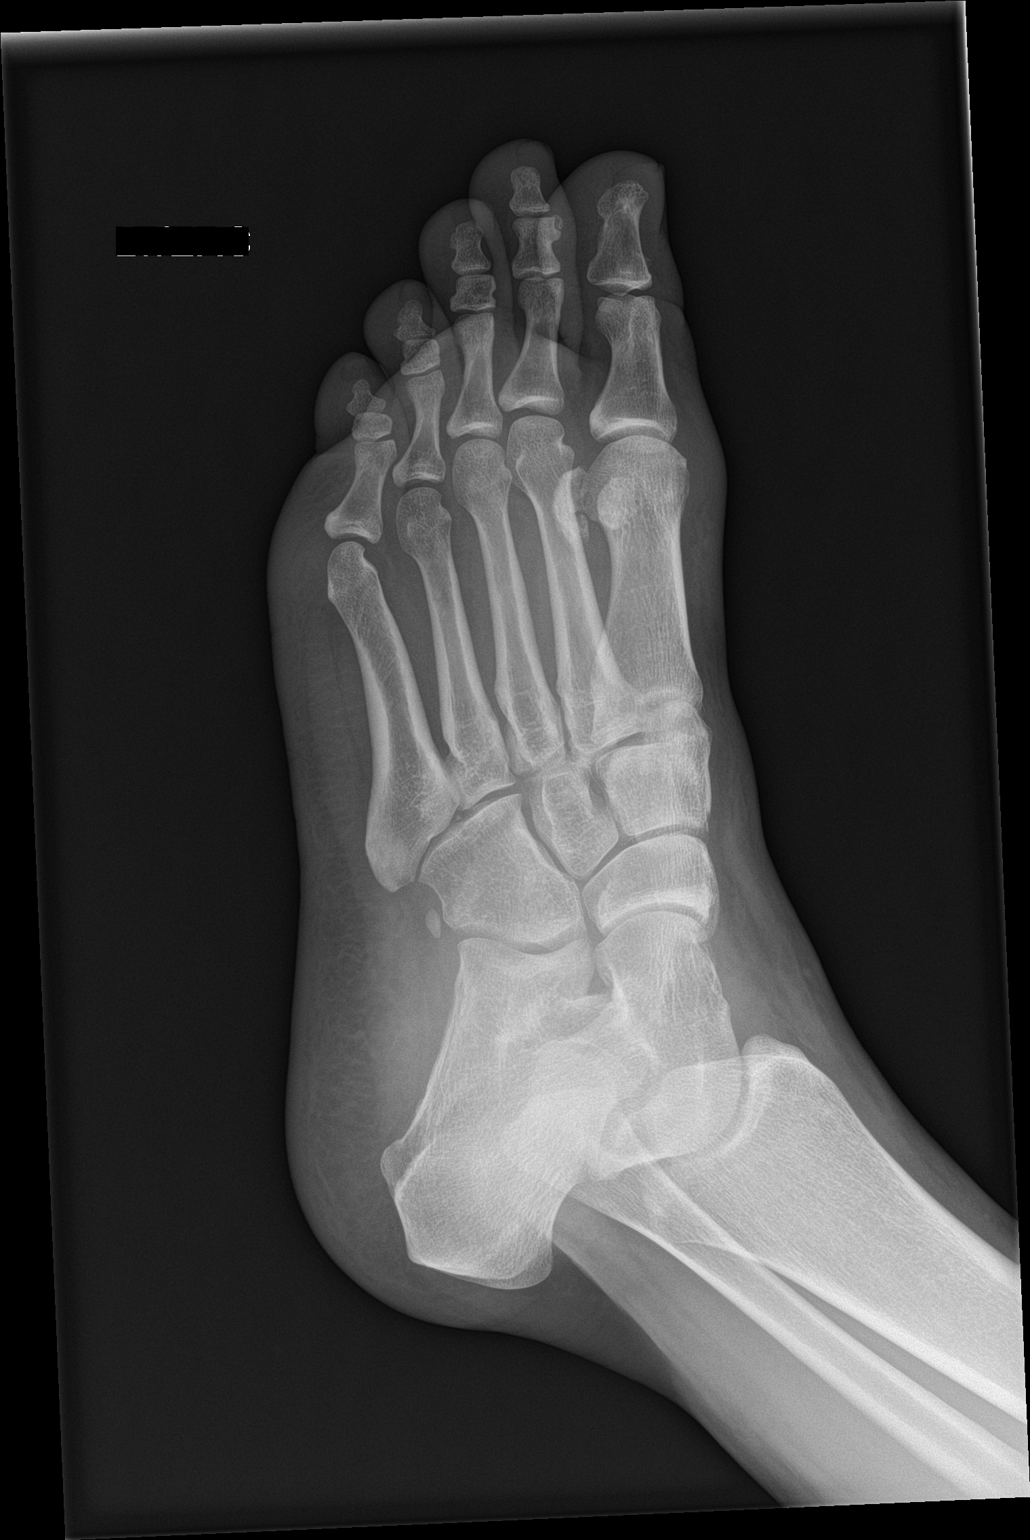

[foot lat]
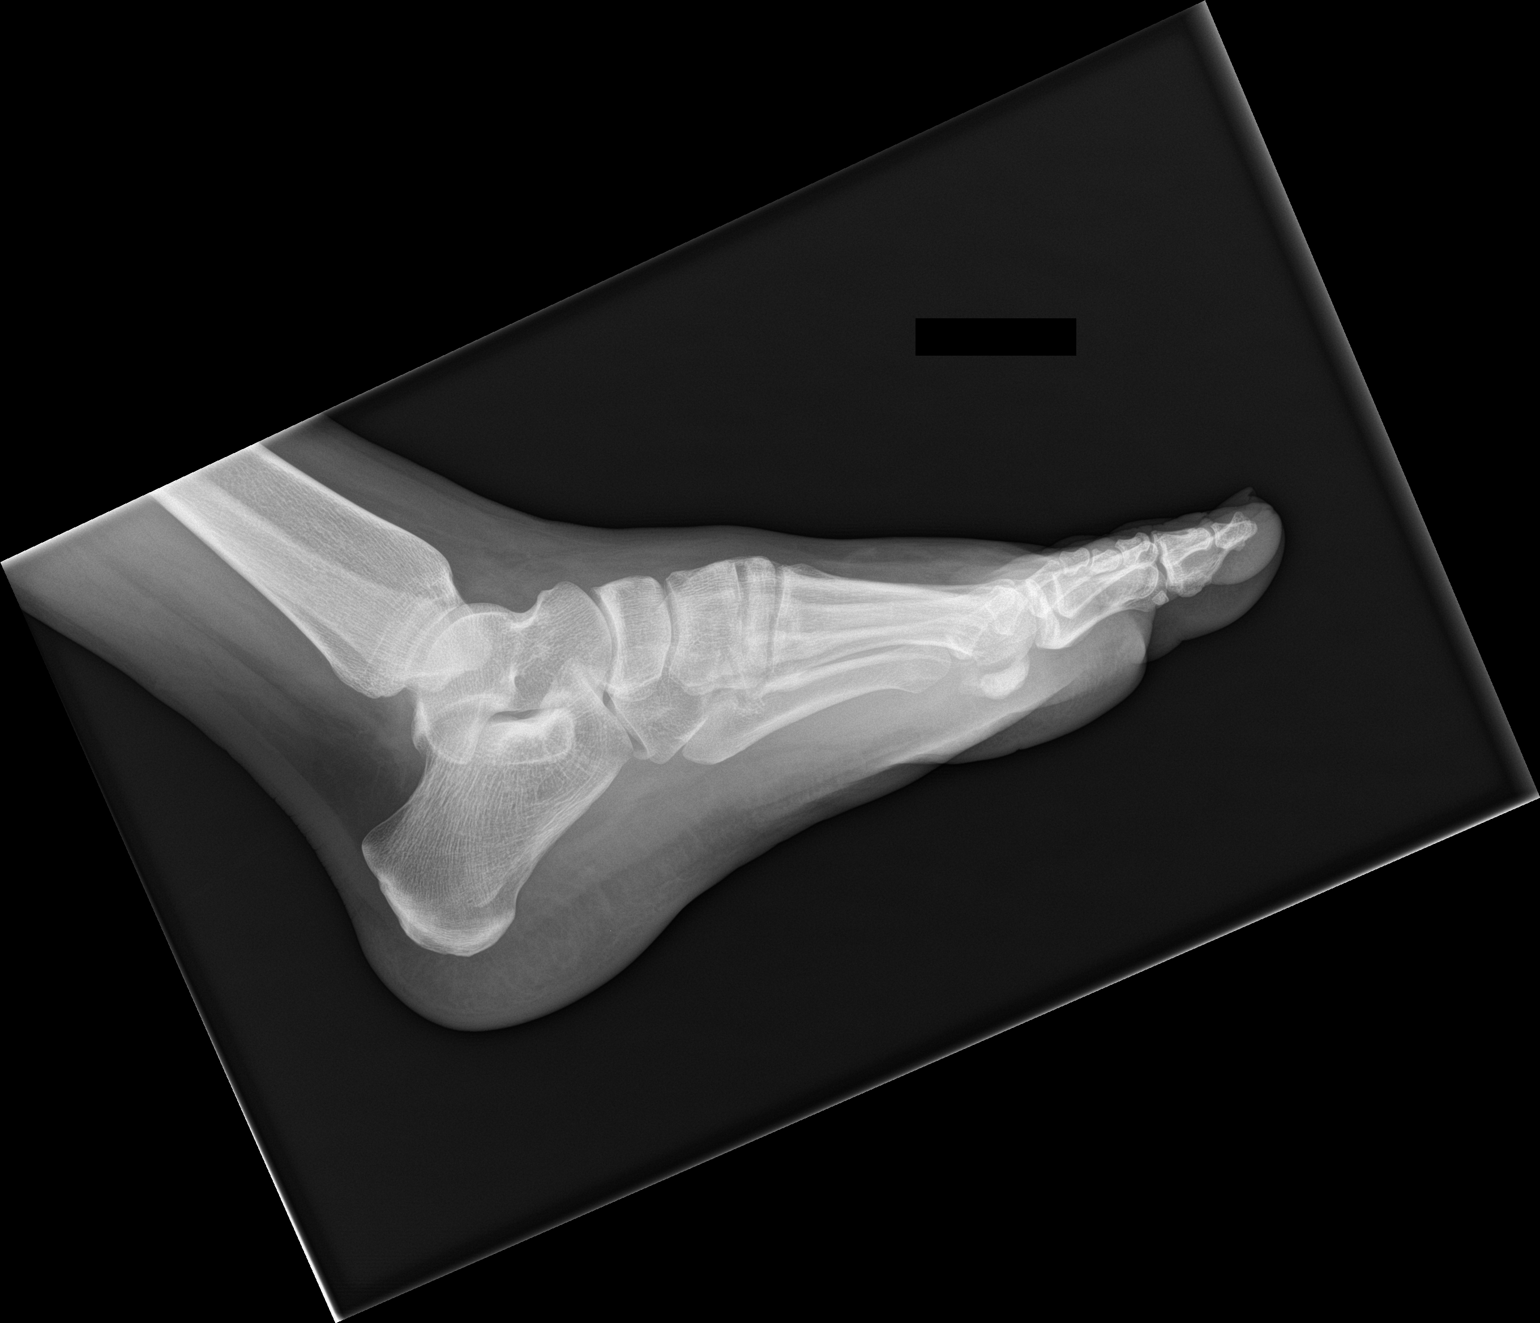

[3 of 3 positions shown; findings below may reference images not displayed]

FINDINGS: There is no evidence of fracture or dislocation. The fibular
sesamoid subjacent to the first metatarsal may be bipartite or have
adjacent heterotopic ossification. Small well corticated osseous
density adjacent to the second toe middle phalanx at the distal
interphalangeal joint appears chronic and may be an accessory
ossicle or sequela of remote prior injury. Normal alignment. Joint
spaces are preserved. No erosion. Soft tissues are unremarkable.
IMPRESSION: 1. No acute osseous abnormality.
2. Bipartite fibular sesamoid versus heterotopic ossification.
3. Small well corticated osseous density adjacent to the second toe
middle phalanx appears chronic and may be an accessory ossicle or
sequela of remote prior injury.

## 2022-08-30 ENCOUNTER — Emergency Department
Admission: EM | Admit: 2022-08-30 | Discharge: 2022-08-30 | Discharge status: Against Medical Advice | Payer: Medicaid Other | Attending: Emergency Medicine | Admitting: Emergency Medicine

## 2022-08-30 ENCOUNTER — Encounter: Payer: Self-pay | Admitting: Emergency Medicine

## 2022-08-30 DIAGNOSIS — M79671 Pain in right foot: Secondary | ICD-10-CM | POA: Diagnosis not present

## 2022-08-30 DIAGNOSIS — M79672 Pain in left foot: Secondary | ICD-10-CM | POA: Diagnosis not present

## 2022-08-30 DIAGNOSIS — M545 Low back pain, unspecified: Secondary | ICD-10-CM | POA: Insufficient documentation

## 2022-08-30 DIAGNOSIS — Z5321 Procedure and treatment not carried out due to patient leaving prior to being seen by health care provider: Secondary | ICD-10-CM | POA: Diagnosis not present

## 2022-08-30 DIAGNOSIS — K649 Unspecified hemorrhoids: Secondary | ICD-10-CM | POA: Diagnosis not present

## 2022-08-30 HISTORY — DX: Gout, unspecified: M10.9

## 2022-08-30 NOTE — ED Triage Notes (Signed)
Pt with c/o lower back pain, bilateral foot pain as well as pain in anus where pt has hemidrosis. All symptoms noted over 2 weeks and with chronic occurences. Pt sts she does not have a PCP and has not had her gout managed.

## 2022-09-02 DIAGNOSIS — F17210 Nicotine dependence, cigarettes, uncomplicated: Secondary | ICD-10-CM | POA: Insufficient documentation

## 2022-09-02 DIAGNOSIS — F419 Anxiety disorder, unspecified: Secondary | ICD-10-CM | POA: Insufficient documentation

## 2023-08-22 ENCOUNTER — Emergency Department: Payer: Medicaid Other

## 2023-08-22 ENCOUNTER — Other Ambulatory Visit: Payer: Self-pay

## 2023-08-22 ENCOUNTER — Encounter: Payer: Self-pay | Admitting: Emergency Medicine

## 2023-08-22 ENCOUNTER — Emergency Department
Admission: EM | Admit: 2023-08-22 | Discharge: 2023-08-22 | Disposition: A | Discharge status: Home / Self Care | Payer: Medicaid Other | Attending: Emergency Medicine | Admitting: Emergency Medicine

## 2023-08-22 DIAGNOSIS — I471 Supraventricular tachycardia, unspecified: Secondary | ICD-10-CM | POA: Diagnosis not present

## 2023-08-22 DIAGNOSIS — R079 Chest pain, unspecified: Secondary | ICD-10-CM | POA: Insufficient documentation

## 2023-08-22 LAB — BASIC METABOLIC PANEL
Anion gap: 12 (ref 5–15)
BUN: <5 mg/dL — ABNORMAL LOW (ref 6–20)
CO2: 18 mmol/L — ABNORMAL LOW (ref 22–32)
Calcium: 8.4 mg/dL — ABNORMAL LOW (ref 8.9–10.3)
Chloride: 105 mmol/L (ref 98–111)
Creatinine, Ser: 0.95 mg/dL (ref 0.44–1.00)
GFR, Estimated: >60 mL/min (ref >60)
Glucose, Bld: 165 mg/dL — ABNORMAL HIGH (ref 70–99)
Potassium: 3.5 mmol/L (ref 3.5–5.1)
Sodium: 135 mmol/L (ref 135–145)

## 2023-08-22 LAB — CBC
HCT: 45.3 % (ref 36.0–46.0)
Hemoglobin: 15.7 g/dL — ABNORMAL HIGH (ref 12.0–15.0)
MCH: 34.9 pg — ABNORMAL HIGH (ref 26.0–34.0)
MCHC: 34.7 g/dL (ref 30.0–36.0)
MCV: 100.7 fL — ABNORMAL HIGH (ref 80.0–100.0)
Platelets: 342 10*3/uL (ref 150–400)
RBC: 4.5 MIL/uL (ref 3.87–5.11)
RDW: 13.4 % (ref 11.5–15.5)
WBC: 16 10*3/uL — ABNORMAL HIGH (ref 4.0–10.5)
nRBC: 0 % (ref 0.0–0.2)

## 2023-08-22 LAB — TROPONIN I (HIGH SENSITIVITY): Troponin I (High Sensitivity): 28 ng/L — ABNORMAL HIGH (ref <18)

## 2023-08-22 MED ORDER — ADENOSINE 12 MG/4ML IV SOLN
12.0000 mg | Freq: Once | INTRAVENOUS | Status: AC
  Administered 2023-08-22: 12 mg via INTRAVENOUS

## 2023-08-22 MED ORDER — SODIUM CHLORIDE 0.9 % IV BOLUS
1000.0000 mL | Freq: Once | INTRAVENOUS | Status: AC
  Administered 2023-08-22: 1000 mL via INTRAVENOUS

## 2023-08-22 NOTE — ED Notes (Addendum)
Raquel, RN, Vicente Males, MD, Pattricia Boss, RN & this RN at bedside. Timeout performed. Patient gives verbal consent for adenosine administration. Code cart at bedside- patient connected to monitor pads. IV established prior to administration of medication.

## 2023-08-22 NOTE — ED Provider Notes (Addendum)
[facility] Provider Note   Event Date/Time   First MD Initiated Contact with Patient 08/22/23 2152     (approximate) History  Chest Pain  HPI Bethany Castaneda is a 42 y.o. female with no stated past medical history who presents complaining of a headache and palpitations that began today and have been stable since onset.  Patient also endorses right-sided chest pain with radiation to the right arm.  Patient denies any family history or personal history of of cardiac disease ROS: Patient currently denies any vision changes, tinnitus, difficulty speaking, facial droop, sore throat, shortness of breath, abdominal pain, nausea/vomiting/diarrhea, dysuria, or weakness/numbness/paresthesias in any extremity   Physical Exam  Triage Vital Signs: ED Triage Vitals [08/22/23 2149]  Encounter Vitals Group     BP      Systolic BP Percentile      Diastolic BP Percentile      Pulse      Resp      Temp      Temp src      SpO2      Weight 145 lb (65.8 kg)     Height 5\' 6"  (1.676 m)     Head Circumference      Peak Flow      Pain Score 10     Pain Loc      Pain Education      Exclude from Growth Chart    Most recent vital signs: Vitals:   08/22/23 2223 08/22/23 2230  BP:  98/68  Pulse: 98 94  Resp: (!) 22 13  Temp:    SpO2: 97% 98%   General: Awake, oriented x4. CV:  Good peripheral perfusion.  Resp:  Normal effort.  Abd:  No distention.  Other:  Middle-aged well-developed, well-nourished Caucasian female resting comfortably in no acute distress ED Results / Procedures / Treatments  Labs (all labs ordered are listed, but only abnormal results are displayed) Labs Reviewed  CBC - Abnormal; Notable for the following components:      Result Value   WBC 16.0 (*)    Hemoglobin 15.7 (*)    MCV 100.7 (*)    MCH 34.9 (*)    All other components within normal limits  BASIC METABOLIC PANEL - Abnormal; Notable for the following components:   CO2 18 (*)     Glucose, Bld 165 (*)    BUN <5 (*)    Calcium 8.4 (*)    All other components within normal limits  TROPONIN I (HIGH SENSITIVITY) - Abnormal; Notable for the following components:   Troponin I (High Sensitivity) 28 (*)    All other components within normal limits  TROPONIN I (HIGH SENSITIVITY)   EKG ED ECG REPORT I, Merwyn Katos, the attending physician, personally viewed and interpreted this ECG. Date: 08/22/2023 EKG Time: 2149 Rate: 194 Rhythm: S VT QRS Axis: normal Intervals: normal ST/T Wave abnormalities: normal Narrative Interpretation: SVT.  No evidence of acute ischemia RADIOLOGY ED MD interpretation: One-view portable chest x-ray interpreted by me shows no evidence of acute abnormalities including no pneumonia, pneumothorax, or widened mediastinum -Agree with radiology assessment Official radiology report(s): DG Chest Portable 1 View Result Date: 08/22/2023 CLINICAL DATA:  Chest pain EXAM: PORTABLE CHEST 1 VIEW COMPARISON:  01/01/2018 FINDINGS: The heart size and mediastinal contours are within normal limits. Both lungs are clear. The visualized skeletal structures are unremarkable. IMPRESSION: No active disease. Electronically Signed   By: Jasmine Pang M.D.   On:  08/22/2023 22:30   PROCEDURES: Critical Care performed: Yes, see critical care procedure note(s) .1-3 Lead EKG Interpretation  Performed by: Merwyn Katos, MD Authorized by: Merwyn Katos, MD     Interpretation: normal     ECG rate:  91   ECG rate assessment: normal     Rhythm: sinus rhythm     Ectopy: none     Conduction: normal   CRITICAL CARE Performed by: Merwyn Katos  Total critical care time: 21 minutes  Critical care time was exclusive of separately billable procedures and treating other patients.  Critical care was necessary to treat or prevent imminent or life-threatening deterioration.  Critical care was time spent personally by me on the following activities: development of  treatment plan with patient and/or surrogate as well as nursing, discussions with consultants, evaluation of patient's response to treatment, examination of patient, obtaining history from patient or surrogate, ordering and performing treatments and interventions, ordering and review of laboratory studies, ordering and review of radiographic studies, pulse oximetry and re-evaluation of patient's condition.  MEDICATIONS ORDERED IN ED: Medications  adenosine (ADENOCARD) 12 MG/4ML injection 12 mg (12 mg Intravenous Given 08/22/23 2214)  sodium chloride 0.9 % bolus 1,000 mL (1,000 mLs Intravenous New Bag/Given 08/22/23 2216)   IMPRESSION / MDM / ASSESSMENT AND PLAN / ED COURSE  I reviewed the triage vital signs and the nursing notes.                             The patient is on the cardiac monitor to evaluate for evidence of arrhythmia and/or significant heart rate changes. Patient's presentation is most consistent with acute presentation with potential threat to life or bodily function. Presents with palpitations and ECG is noted to be indicative of supraventricular tachycardia. Palpitations unlikely secondary to other concomitant cause such as pulmonary embolus or ACS.  Interventions: Patient did not respond to vagal maneuvers. The patient's vital signs and clinical condition warranted the use chemical cardioversion, and given their rhythm adenosine was chosen. A time out was undertaken to assure that this was the correct patient and the correct procedure for this patient. Adenosine 12 mg was given in the standard rapid IV push fashion. The result of this chemical cardioversion was a return to a normal sinus rhythm. The patient tolerated this procedure very well, there were no complications.  Post Conversion ECG: Without overt e/o STEMI, Brugada's sign, delta wave, epsilon wave, significantly prolonged QTc, or malignant arrhythmia.  Disposition: Counseled patient to avoid  stimulants. Counseled patient to follow up within 24-48 hours with their cardiologist and primary care doctors. Discharge home with SRP.   FINAL CLINICAL IMPRESSION(S) / ED DIAGNOSES   Final diagnoses:  SVT (supraventricular tachycardia) (HCC)  Chest pain, unspecified type   Rx / DC Orders   ED Discharge Orders          Ordered    Ambulatory referral to Cardiology       Comments: If you have not heard from the Cardiology office within the next 72 hours please call [phone].   08/22/23 2301           Note:  This document was prepared using Dragon voice recognition software and may include unintentional dictation errors.   Merwyn Katos, MD 08/22/23 2340    Merwyn Katos, MD 08/22/23 [phone]

## 2023-08-22 NOTE — ED Triage Notes (Signed)
Pt to triage via w/c, appears anxious; st has had HA "all day today"; PTA began having sensation of heart racing accomp by pain to rt side chest and into rt arm

## 2023-08-22 NOTE — ED Notes (Addendum)
Patient now 112 sinus tach on the monitor

## 2023-08-22 NOTE — ED Notes (Addendum)
Patient 177 SVT rhythm on the monitor

## 2023-08-25 ENCOUNTER — Ambulatory Visit: Payer: Medicaid Other | Attending: Cardiology | Admitting: Cardiology

## 2023-08-25 ENCOUNTER — Encounter: Payer: Self-pay | Admitting: Cardiology

## 2023-08-25 VITALS — BP 118/80 | HR 89 | Ht 66.0 in | Wt 158.6 lb

## 2023-08-25 DIAGNOSIS — I471 Supraventricular tachycardia, unspecified: Secondary | ICD-10-CM

## 2023-08-25 DIAGNOSIS — F17200 Nicotine dependence, unspecified, uncomplicated: Secondary | ICD-10-CM

## 2023-08-25 DIAGNOSIS — R072 Precordial pain: Secondary | ICD-10-CM

## 2023-08-25 MED ORDER — METOPROLOL TARTRATE 100 MG PO TABS: 100.0000 mg | ORAL_TABLET | Freq: Once | ORAL | 0 refills | Status: DC

## 2023-08-25 NOTE — Progress Notes (Signed)
Cardiology Office Note:    Date:  08/25/2023   ID:  Bethany Castaneda, DOB 04/12/1981, MRN [id number]  PCP:  Aviva Kluver   [facility] Providers Cardiologist:  Debbe Odea, MD     Referring MD: Merwyn Katos, MD   Chief Complaint  Patient presents with   New Patient (Initial Visit)    Referred for cardiac evaluation of supraventricular tachycardia and chest pain.  Couple days ago patient was feeling tachycardiac with right side chest pain that radiated down the right arm.      History of Present Illness:    Bethany Castaneda is a 42 y.o. female with a hx of SVT, current smoker x 20 years, gout who presents with palpitations and right-sided chest pain.  Patient presented to the ED 3 days ago with symptoms of palpitations and right-sided chest pain.  She was at home and felt lightheaded, noticed palpitations.  She tried to lay down but symptoms persisted.  Husband checked her blood pressure and heart rate, heart rate was in the 180s beats per minute.  Patient brought to the ED by husband.  EKG in the ED did reveal SVT heart rate 194.  Patient did not respond to vagal maneuvers, IV adenosine was administered with chemical conversion.  She has not had any further episodes since.  Endorse smoking.  Denies any personal history of heart disease.  Endorses right-sided chest pain.  Past Medical History:  Diagnosis Date   Anxiety    Chronic back pain 12/15/2019   Gout    Migraine    Thyroid disease     Past Surgical History:  Procedure Laterality Date   HERNIA REPAIR     OTHER SURGICAL HISTORY     vulvar cyst removal   WISDOM TOOTH EXTRACTION      Current Medications: Current Meds  Medication Sig   colchicine 0.6 MG tablet On the first day, take 2 tablets and then 1 hour later, take 1 more tablet.  After the first day, take 1 tablet until flare resolves.   folic acid (FOLVITE) 1 MG tablet Take 1 mg by mouth daily.   levonorgestrel (MIRENA) 20 MCG/24HR IUD 1 Intra  Uterine Device (1 each total) by Intrauterine route once.   metoprolol tartrate (LOPRESSOR) 100 MG tablet Take 1 tablet (100 mg total) by mouth once for 1 dose. TAKE TWO HOURS PRIOR TO CARDIAC CT   potassium chloride (MICRO-K) 10 MEQ CR capsule Take 10 mEq by mouth 2 (two) times daily.     Allergies:   Patient has no known allergies.   Social History   Socioeconomic History   Marital status: Married    Spouse name: Not on file   Number of children: Not on file   Years of education: Not on file   Highest education level: Not on file  Occupational History   Not on file  Tobacco Use   Smoking status: Every Day    Current packs/day: 1.00    Average packs/day: 1 pack/day for 20.0 years (20.0 ttl pk-yrs)    Types: Cigarettes    Start date: 08/25/2003   Smokeless tobacco: Never  Vaping Use   Vaping status: Never Used  Substance and Sexual Activity   Alcohol use: Yes    Comment: social drinker / ocasional   Drug use: No   Sexual activity: Yes    Birth control/protection: None  Other Topics Concern   Not on file  Social History Narrative   Not on file  Social Drivers of Corporate investment banker Strain: Low Risk  (09/02/2022)   Received from [facility]   Overall Financial Resource Strain (CARDIA)    Difficulty of Paying Living Expenses: Not very hard  Food Insecurity: No Food Insecurity (09/02/2022)   Received from [facility]   Hunger Vital Sign    Worried About Running Out of Food in the Last Year: Never true    Ran Out of Food in the Last Year: Never true  Transportation Needs: No Transportation Needs (09/02/2022)   Received from [facility] - [facility]    Lack of Transportation (Medical): No    Lack of Transportation (Non-Medical): No  Physical Activity: Not on file  Stress: Not on file  Social Connections: Not on file     Family History: The patient's family history includes Heart attack in her maternal grandfather; Leukemia in her  maternal grandmother.  ROS:   Please see the history of present illness.     All other systems reviewed and are negative.  EKGs/Labs/Other Studies Reviewed:    The following studies were reviewed today:  EKG Interpretation Date/Time:  Friday August 25 2023 09:05:55 EST Ventricular Rate:  89 PR Interval:  126 QRS Duration:  70 QT Interval:  354 QTC Calculation: 430 R Axis:   39  Text Interpretation: Normal sinus rhythm Normal ECG Confirmed by Debbe Odea ([id number]) on 08/25/2023 9:23:59 AM    Recent Labs: 08/22/2023: BUN <5; Creatinine, Ser 0.95; Hemoglobin 15.7; Platelets 342; Potassium 3.5; Sodium 135  Recent Lipid Panel No results found for: "CHOL", "TRIG", "HDL", "CHOLHDL", "VLDL", "LDLCALC", "LDLDIRECT"   Risk Assessment/Calculations:             Physical Exam:    VS:  BP 118/80 (BP Location: Left Arm, Patient Position: Sitting, Cuff Size: Normal)   Pulse 89   Ht 5\' 6"  (1.676 m)   Wt 158 lb 9.6 oz (71.9 kg)   SpO2 98%   BMI 25.60 kg/m     Wt Readings from Last 3 Encounters:  08/25/23 158 lb 9.6 oz (71.9 kg)  08/22/23 145 lb (65.8 kg)  08/30/22 134 lb (60.8 kg)     GEN:  Well nourished, well developed in no acute distress HEENT: Normal NECK: No JVD; No carotid bruits CARDIAC: RRR, no murmurs, rubs, gallops RESPIRATORY:  Clear to auscultation without rales, wheezing or rhonchi  ABDOMEN: Soft, non-tender, non-distended MUSCULOSKELETAL:  No edema; No deformity  SKIN: Warm and dry NEUROLOGIC:  Alert and oriented x 3 PSYCHIATRIC:  Normal affect   ASSESSMENT:    1. Supraventricular tachycardia (HCC)   2. Precordial pain   3. Smoking    PLAN:    In order of problems listed above:  SVT s/p adenosine in the ED.  Start Toprol-XL 25 mg daily.  Get echo.  Refer to EP for ablation consideration. Right-sided chest pain, current smoker.  Get coronary CT to rule out CAD. Current smoker, smoking cessation advised.  Follow-up after cardiac testing       Medication Adjustments/Labs and Tests Ordered: Current medicines are reviewed at length with the patient today.  Concerns regarding medicines are outlined above.  Orders Placed This Encounter  Procedures   CT CORONARY MORPH W/CTA COR W/SCORE W/CA W/CM &/OR WO/CM   Basic Metabolic Panel (BMET)   Ambulatory referral to Cardiac Electrophysiology   EKG 12-Lead   ECHOCARDIOGRAM COMPLETE   Meds ordered this encounter  Medications   metoprolol tartrate (LOPRESSOR) 100  MG tablet    Sig: Take 1 tablet (100 mg total) by mouth once for 1 dose. TAKE TWO HOURS PRIOR TO CARDIAC CT    Dispense:  1 tablet    Refill:  0    Patient Instructions  Medication Instructions:   START Metoprolol Succinate - Take one tablet ( 25mg ) by mouth daily.   *If you need a refill on your cardiac medications before your next appointment, please call your pharmacy*   Lab Work:  Your physician recommends you have labs - BMP   If you have labs (blood work) drawn today and your tests are completely normal, you will receive your results only by: MyChart Message (if you have MyChart) OR A paper copy in the mail If you have any lab test that is abnormal or we need to change your treatment, we will call you to review the results.   Testing/Procedures:  Your physician has requested that you have an echocardiogram. Echocardiography is a painless test that uses sound waves to create images of your heart. It provides your doctor with information about the size and shape of your heart and how well your heart's chambers and valves are working. This procedure takes approximately one hour. There are no restrictions for this procedure. Please do NOT wear cologne, perfume, aftershave, or lotions (deodorant is allowed). Please arrive 15 minutes prior to your appointment time.  Please note: We ask at that you not bring children with you during ultrasound (echo/ vascular) testing. Due to room size and safety concerns, children  are not allowed in the ultrasound rooms during exams. Our front office staff cannot provide observation of children in our lobby area while testing is being conducted. An adult accompanying a patient to their appointment will only be allowed in the ultrasound room at the discretion of the ultrasound technician under special circumstances. We apologize for any inconvenience.    Your cardiac CT will be scheduled at one of the below locations:   [facility] [address] [place], [place] [address] [phone]  OR   [facility] [address] [place], [place] [address] [phone]  If scheduled at [facility] or [facility], please arrive 15 mins early for check-in and test prep.  There is spacious parking and easy access to the radiology department from the [facility] Heart and Vascular entrance. Please enter here and check-in with the desk attendant.   Please follow these instructions carefully (unless otherwise directed):  An IV will be required for this test and Nitroglycerin will be given.  Hold all erectile dysfunction medications at least 3 days (72 hrs) prior to test. (Ie viagra, cialis, sildenafil, tadalafil, etc)   On the Night Before the Test: Be sure to Drink plenty of water. Do not consume any caffeinated/decaffeinated beverages or chocolate 12 hours prior to your test. Do not take any antihistamines 12 hours prior to your test.  On the Day of the Test: Drink plenty of water until 1 hour prior to the test. Do not eat any food 1 hour prior to test. You may take your regular medications prior to the test.  Take metoprolol (Lopressor) two hours prior to test. Patients who wear a continuous glucose monitor MUST remove the device prior to scanning. FEMALES- please wear underwire-free bra if available, avoid dresses & tight clothing      After the Test: Drink  plenty of water. After receiving IV contrast, you may  experience a mild flushed feeling. This is normal. On occasion, you may experience a mild rash up to 24 hours after the test. This is not dangerous. If this occurs, you can take Benadryl 25 mg and increase your fluid intake. If you experience trouble breathing, this can be serious. If it is severe call 911 IMMEDIATELY. If it is mild, please call our office.  We will call to schedule your test 2-4 weeks out understanding that some insurance companies will need an authorization prior to the service being performed.   For more information and frequently asked questions, please visit our website : [email or web address]  For non-scheduling related questions, please contact the cardiac imaging nurse navigator should you have any questions/concerns: Cardiac Imaging Nurse Navigators Direct Office Dial: [phone]   For scheduling needs, including cancellations and rescheduling, please call [place], [phone].   Follow-Up: At [facility], you and your health needs are our priority.  As part of our continuing mission to provide you with exceptional heart care, we have created designated Provider Care Teams.  These Care Teams include your primary Cardiologist (physician) and Advanced Practice Providers (APPs -  Physician Assistants and Nurse Practitioners) who all work together to provide you with the care you need, when you need it.  We recommend signing up for the patient portal called "MyChart".  Sign up information is provided on this After Visit Summary.  MyChart is used to connect with patients for Virtual Visits (Telemedicine).  Patients are able to view lab/test results, encounter notes, upcoming appointments, etc.  Non-urgent messages can be sent to your provider as well.   To learn more about what you can do with MyChart, go to [email or web address].    Your next appointment:    8 weeks  Provider:   You may  see Debbe Odea, MD or one of the following Advanced Practice Providers on your designated Care Team:   Nicolasa Ducking, NP Eula Listen, PA-C Cadence Fransico Michael, PA-C Charlsie Quest, NP Carlos Levering, NP     Signed, Debbe Odea, MD  08/25/2023 11:09 AM    [facility]

## 2023-08-25 NOTE — Patient Instructions (Signed)
Medication Instructions:   START Metoprolol Succinate - Take one tablet ( 25mg ) by mouth daily.   *If you need a refill on your cardiac medications before your next appointment, please call your pharmacy*   Lab Work:  Your physician recommends you have labs - BMP   If you have labs (blood work) drawn today and your tests are completely normal, you will receive your results only by: MyChart Message (if you have MyChart) OR A paper copy in the mail If you have any lab test that is abnormal or we need to change your treatment, we will call you to review the results.   Testing/Procedures:  Your physician has requested that you have an echocardiogram. Echocardiography is a painless test that uses sound waves to create images of your heart. It provides your doctor with information about the size and shape of your heart and how well your heart's chambers and valves are working. This procedure takes approximately one hour. There are no restrictions for this procedure. Please do NOT wear cologne, perfume, aftershave, or lotions (deodorant is allowed). Please arrive 15 minutes prior to your appointment time.  Please note: We ask at that you not bring children with you during ultrasound (echo/ vascular) testing. Due to room size and safety concerns, children are not allowed in the ultrasound rooms during exams. Our front office staff cannot provide observation of children in our lobby area while testing is being conducted. An adult accompanying a patient to their appointment will only be allowed in the ultrasound room at the discretion of the ultrasound technician under special circumstances. We apologize for any inconvenience.    Your cardiac CT will be scheduled at one of the below locations:   [facility] [address] [place], [place] [address] [phone]  OR   [facility] [address] [place], [place]  [address] [phone]  If scheduled at [facility] or [facility], please arrive 15 mins early for check-in and test prep.  There is spacious parking and easy access to the radiology department from the [facility] Heart and Vascular entrance. Please enter here and check-in with the desk attendant.   Please follow these instructions carefully (unless otherwise directed):  An IV will be required for this test and Nitroglycerin will be given.  Hold all erectile dysfunction medications at least 3 days (72 hrs) prior to test. (Ie viagra, cialis, sildenafil, tadalafil, etc)   On the Night Before the Test: Be sure to Drink plenty of water. Do not consume any caffeinated/decaffeinated beverages or chocolate 12 hours prior to your test. Do not take any antihistamines 12 hours prior to your test.  On the Day of the Test: Drink plenty of water until 1 hour prior to the test. Do not eat any food 1 hour prior to test. You may take your regular medications prior to the test.  Take metoprolol (Lopressor) two hours prior to test. Patients who wear a continuous glucose monitor MUST remove the device prior to scanning. FEMALES- please wear underwire-free bra if available, avoid dresses & tight clothing      After the Test: Drink plenty of water. After receiving IV contrast, you may experience a mild flushed feeling. This is normal. On occasion, you may experience a mild rash up to 24 hours after the test. This is not dangerous. If this occurs, you can take Benadryl 25 mg and increase your fluid intake. If you experience trouble  breathing, this can be serious. If it is severe call 911 IMMEDIATELY. If it is mild, please call our office.  We will call to schedule your test 2-4 weeks out understanding that some insurance companies will need an authorization prior to the service being performed.   For more information and frequently asked questions, please visit our  website : [email or web address]  For non-scheduling related questions, please contact the cardiac imaging nurse navigator should you have any questions/concerns: Cardiac Imaging Nurse Navigators Direct Office Dial: [phone]   For scheduling needs, including cancellations and rescheduling, please call [place], [phone].   Follow-Up: At [facility], you and your health needs are our priority.  As part of our continuing mission to provide you with exceptional heart care, we have created designated Provider Care Teams.  These Care Teams include your primary Cardiologist (physician) and Advanced Practice Providers (APPs -  Physician Assistants and Nurse Practitioners) who all work together to provide you with the care you need, when you need it.  We recommend signing up for the patient portal called "MyChart".  Sign up information is provided on this After Visit Summary.  MyChart is used to connect with patients for Virtual Visits (Telemedicine).  Patients are able to view lab/test results, encounter notes, upcoming appointments, etc.  Non-urgent messages can be sent to your provider as well.   To learn more about what you can do with MyChart, go to [email or web address].    Your next appointment:    8 weeks  Provider:   You may see Debbe Odea, MD or one of the following Advanced Practice Providers on your designated Care Team:   Nicolasa Ducking, NP Eula Listen, PA-C Cadence Fransico Michael, PA-C Charlsie Quest, NP Carlos Levering, NP

## 2023-08-26 LAB — BASIC METABOLIC PANEL
BUN/Creatinine Ratio: 5 — ABNORMAL LOW (ref 9–23)
BUN: 4 mg/dL — ABNORMAL LOW (ref 6–24)
CO2: 21 mmol/L (ref 20–29)
Calcium: 9.6 mg/dL (ref 8.7–10.2)
Chloride: 101 mmol/L (ref 96–106)
Creatinine, Ser: 0.78 mg/dL (ref 0.57–1.00)
Glucose: 100 mg/dL — ABNORMAL HIGH (ref 70–99)
Potassium: 5.2 mmol/L (ref 3.5–5.2)
Sodium: 138 mmol/L (ref 134–144)
eGFR: 97 mL/min/{1.73_m2} (ref >59)

## 2023-08-29 ENCOUNTER — Other Ambulatory Visit (HOSPITAL_COMMUNITY): Payer: Self-pay

## 2023-08-29 ENCOUNTER — Telehealth: Payer: Self-pay | Admitting: Cardiology

## 2023-08-29 MED ORDER — METOPROLOL SUCCINATE ER 25 MG PO TB24
25.0000 mg | ORAL_TABLET | Freq: Every day | ORAL | 3 refills | Status: DC
  Filled 2023-08-29 – 2023-09-13 (×2): qty 90, 90d supply, fill #0

## 2023-08-29 NOTE — Telephone Encounter (Signed)
The patient called stating she was under the impression that she was supposed to start a medication daily, but she only received a prescription for 1 tablet of Metoprolol 100 mg daily to take prior to the CTA.  According to the last office visit on 08/25/23, Dr. Azucena Cecil recommended the patient start Metoprolol Succinate 25 mg daily. The patient was made aware, and the medication was sent to the requested pharmacy.  The nurse will also forward a message to the MD to inquire if he recommends the patient hold the Toprol on the day of the CTA or take it in addition to the Lopressor 100 mg. The patient's heart rate was 89 at the last office visit and BP 118/80.

## 2023-08-29 NOTE — Telephone Encounter (Signed)
Pt is requesting a callback regarding her most recent office visit. She stated she was told she'd be prescribed a medication to take daily but she hadn't heard anything from her pharmacy. She stated she only received the medication she was advised to take before her CT. Please advise.

## 2023-09-01 ENCOUNTER — Telehealth (HOSPITAL_COMMUNITY): Payer: Self-pay | Admitting: *Deleted

## 2023-09-01 NOTE — Telephone Encounter (Signed)
Attempted to call patient regarding upcoming cardiac CT appointment. Left message on voicemail with name and callback number Hayley Sharpe RN Navigator Cardiac Imaging [facility] [phone] Office   

## 2023-09-04 ENCOUNTER — Ambulatory Visit
Admission: RE | Admit: 2023-09-04 | Discharge: 2023-09-04 | Disposition: A | Discharge status: Home / Self Care | Payer: Medicaid Other | Source: Ambulatory Visit | Attending: Cardiology | Admitting: Cardiology

## 2023-09-04 DIAGNOSIS — R072 Precordial pain: Secondary | ICD-10-CM | POA: Diagnosis present

## 2023-09-04 MED ORDER — IOHEXOL 350 MG/ML SOLN
80.0000 mL | Freq: Once | INTRAVENOUS | Status: AC | PRN
  Administered 2023-09-04: 80 mL via INTRAVENOUS

## 2023-09-04 MED ORDER — METOPROLOL TARTRATE 5 MG/5ML IV SOLN
INTRAVENOUS | Status: AC
  Filled 2023-09-04: qty 5

## 2023-09-04 MED ORDER — NITROGLYCERIN 0.4 MG SL SUBL
0.8000 mg | SUBLINGUAL_TABLET | Freq: Once | SUBLINGUAL | Status: AC
  Administered 2023-09-04: 0.8 mg via SUBLINGUAL
  Filled 2023-09-04: qty 25

## 2023-09-04 MED ORDER — DILTIAZEM HCL 25 MG/5ML IV SOLN
10.0000 mg | INTRAVENOUS | Status: DC | PRN
  Filled 2023-09-04: qty 5

## 2023-09-04 MED ORDER — METOPROLOL TARTRATE 5 MG/5ML IV SOLN
10.0000 mg | Freq: Once | INTRAVENOUS | Status: AC | PRN
Start: 2023-09-04 — End: 2023-09-04
  Administered 2023-09-04: 5 mg via INTRAVENOUS
  Filled 2023-09-04: qty 10

## 2023-09-04 NOTE — Addendum Note (Signed)
Encounter addended by: Crista Elliot on: 09/04/2023 1:18 PM  Actions taken: Imaging Exam ended

## 2023-09-04 NOTE — Progress Notes (Signed)

## 2023-09-07 NOTE — Telephone Encounter (Signed)
 Cardiac CT has already been completed.

## 2023-09-08 ENCOUNTER — Other Ambulatory Visit (HOSPITAL_COMMUNITY): Payer: Self-pay

## 2023-09-09 ENCOUNTER — Emergency Department
Admission: EM | Admit: 2023-09-09 | Discharge: 2023-09-09 | Disposition: A | Discharge status: Home / Self Care | Payer: Medicaid Other | Attending: Emergency Medicine | Admitting: Emergency Medicine

## 2023-09-09 ENCOUNTER — Emergency Department: Payer: Medicaid Other

## 2023-09-09 ENCOUNTER — Other Ambulatory Visit: Payer: Self-pay

## 2023-09-09 DIAGNOSIS — R000 Tachycardia, unspecified: Secondary | ICD-10-CM | POA: Insufficient documentation

## 2023-09-09 DIAGNOSIS — R002 Palpitations: Secondary | ICD-10-CM | POA: Insufficient documentation

## 2023-09-09 DIAGNOSIS — F419 Anxiety disorder, unspecified: Secondary | ICD-10-CM | POA: Insufficient documentation

## 2023-09-09 LAB — BASIC METABOLIC PANEL
Anion gap: 19 — ABNORMAL HIGH (ref 5–15)
BUN: 6 mg/dL (ref 6–20)
CO2: 15 mmol/L — ABNORMAL LOW (ref 22–32)
Calcium: 8.7 mg/dL — ABNORMAL LOW (ref 8.9–10.3)
Chloride: 101 mmol/L (ref 98–111)
Creatinine, Ser: 0.71 mg/dL (ref 0.44–1.00)
GFR, Estimated: >60 mL/min (ref >60)
Glucose, Bld: 107 mg/dL — ABNORMAL HIGH (ref 70–99)
Potassium: 3.3 mmol/L — ABNORMAL LOW (ref 3.5–5.1)
Sodium: 135 mmol/L (ref 135–145)

## 2023-09-09 LAB — CBC
HCT: 49.6 % — ABNORMAL HIGH (ref 36.0–46.0)
Hemoglobin: 17.4 g/dL — ABNORMAL HIGH (ref 12.0–15.0)
MCH: 34.3 pg — ABNORMAL HIGH (ref 26.0–34.0)
MCHC: 35.1 g/dL (ref 30.0–36.0)
MCV: 97.6 fL (ref 80.0–100.0)
Platelets: 336 10*3/uL (ref 150–400)
RBC: 5.08 MIL/uL (ref 3.87–5.11)
RDW: 12.9 % (ref 11.5–15.5)
WBC: 10.5 10*3/uL (ref 4.0–10.5)
nRBC: 0 % (ref 0.0–0.2)

## 2023-09-09 LAB — TROPONIN I (HIGH SENSITIVITY): Troponin I (High Sensitivity): 7 ng/L (ref <18)

## 2023-09-09 MED ORDER — SODIUM CHLORIDE 0.9 % IV BOLUS
1000.0000 mL | Freq: Once | INTRAVENOUS | Status: AC
  Administered 2023-09-09: 1000 mL via INTRAVENOUS

## 2023-09-09 MED ORDER — LORAZEPAM 2 MG/ML IJ SOLN
1.0000 mg | Freq: Once | INTRAMUSCULAR | Status: AC
Start: 2023-09-09 — End: 2023-09-09
  Administered 2023-09-09: 1 mg via INTRAVENOUS
  Filled 2023-09-09: qty 1

## 2023-09-09 NOTE — ED Triage Notes (Addendum)
 Pt to ED via POV c/o palpitations and CP. Pt reports she started feeling weird about 2 hrs ago. Was watching tv when her heart started racing and had chest tightness. Pt was seen here 12/17 for similar symptoms, had SVT. Pt anxious in triage

## 2023-09-09 NOTE — Discharge Instructions (Signed)
 Please seek medical attention for any high fevers, chest pain, shortness of breath, change in behavior, persistent vomiting, bloody stool or any other new or concerning symptoms.

## 2023-09-09 NOTE — ED Provider Notes (Signed)
 [facility] Provider Note    Event Date/Time   First MD Initiated Contact with Patient 09/09/23 2215     (approximate)   History   Palpitations and Tachycardia   HPI  Bethany Castaneda is a 43 y.o. female who presents to the emergency department today because of concerns for palpitations and tachycardia.  Symptoms started this evening.  She was watching TV with her family when the symptoms started.  She does have a history of SVT and became very concerned that that was what was going on.  She continues to have palpitations and states she has been feeling anxious about this.  Does have a history of anxiety.     Physical Exam   Triage Vital Signs: ED Triage Vitals  Encounter Vitals Group     BP 09/09/23 2121 (!) 153/108     Systolic BP Percentile --      Diastolic BP Percentile --      Pulse Rate 09/09/23 2121 (!) 130     Resp 09/09/23 2121 (!) 24     Temp 09/09/23 2124 99.2 F (37.3 C)     Temp Source 09/09/23 2124 Oral     SpO2 09/09/23 2121 99 %     Weight 09/09/23 2120 150 lb (68 kg)     Height 09/09/23 2120 5' 6 (1.676 m)     Head Circumference --      Peak Flow --      Pain Score --      Pain Loc --      Pain Education --      Exclude from Growth Chart --     Most recent vital signs: Vitals:   09/09/23 2121 09/09/23 2124  BP: (!) 153/108   Pulse: (!) 130   Resp: (!) 24   Temp:  99.2 F (37.3 C)  SpO2: 99%    General: Awake, alert, oriented. CV:  Good peripheral perfusion. Tachycardia. Resp:  Normal effort. Lungs clear. Abd:  No distention.    ED Results / Procedures / Treatments   Labs (all labs ordered are listed, but only abnormal results are displayed) Labs Reviewed  BASIC METABOLIC PANEL - Abnormal; Notable for the following components:      Result Value   Potassium 3.3 (*)    CO2 15 (*)    Glucose, Bld 107 (*)    Calcium 8.7 (*)    Anion gap 19 (*)    All other components within normal limits  CBC - Abnormal;  Notable for the following components:   Hemoglobin 17.4 (*)    HCT 49.6 (*)    MCH 34.3 (*)    All other components within normal limits  POC URINE PREG, ED  TROPONIN I (HIGH SENSITIVITY)  TROPONIN I (HIGH SENSITIVITY)     EKG  I, Guadalupe Eagles, attending physician, personally viewed and interpreted this EKG  EKG Time: 2117 Rate: 133 Rhythm: sinus tachycardia Axis: normal Intervals: qtc 452 QRS: narrow, q waves v1 ST changes: no st elevation Impression: abnormal ekg   RADIOLOGY I independently interpreted and visualized the CXR. My interpretation: No pneumonia Radiology interpretation:  IMPRESSION:  No active cardiopulmonary disease.      PROCEDURES:  Critical Care performed: No   MEDICATIONS ORDERED IN ED: Medications - No data to display   IMPRESSION / MDM / ASSESSMENT AND PLAN / ED COURSE  I reviewed the triage vital signs and the nursing notes.  Differential diagnosis includes, but is not limited to, arrhythmia, dehydration, infection, panic attack  Patient's presentation is most consistent with acute presentation with potential threat to life or bodily function.   The patient is on the cardiac monitor to evaluate for evidence of arrhythmia and/or significant heart rate changes.  Patient presented to the emergency department today because of concerns for tachycardia and palpitations.  Exam patient appears quite anxious.  She is tachycardic.  Blood work without concerning leukocytosis or anemia.  EKG shows a sinus tachycardia.  This time I think likely patient's symptoms are related to panic attack.  Will give IV fluids and antianxiolytic.    Patient does feel better after IV fluids and antianxiolytics.  Heart rate did improve.  I did discuss likelihood of panic attack with the patient.  Blood work without any concerning leukocytosis, anemia or troponin elevation.  Will plan on discharging.  Patient already has appointment  with her doctor in 2 days.  FINAL CLINICAL IMPRESSION(S) / ED DIAGNOSES   Final diagnoses:  Anxiety  Sinus tachycardia    Note:  This document was prepared using Dragon voice recognition software and may include unintentional dictation errors.    Floy Roberts, MD 09/09/23 [phone]

## 2023-09-13 ENCOUNTER — Other Ambulatory Visit: Payer: Self-pay

## 2023-09-15 ENCOUNTER — Ambulatory Visit: Payer: Medicaid Other | Attending: Cardiology

## 2023-10-23 ENCOUNTER — Ambulatory Visit: Payer: Medicaid Other | Admitting: Cardiology

## 2023-10-24 NOTE — Progress Notes (Deleted)
  Electrophysiology Office Note:    Date:  10/24/2023   ID:  Bethany Castaneda, DOB 1981-07-24, MRN [id number]  [facility] Cardiologist:  Debbe Odea, MD  [facility] Electrophysiologist:  Lanier Prude, MD   Referring MD: Debbe Odea, MD   Chief Complaint: SVT  History of Present Illness:    Bethany Castaneda is a 43 year old woman who I am seeing today for an evaluation of SVT at the request of Dr. Azucena Cecil.  The patient has a history of tobacco abuse, SVT, gout.  The patient last saw Dr. Azucena Cecil August 25, 2023.  That appointment was shortly after an emergency department visit for palpitations and right sided chest pain.  Her heart rate at the time was 180 bpm.  EKG in the emergency department showed a heart rate of 194 bpm.  Adenosine was given which converted her back to sinus rhythm.  At the appoint with Dr. Azucena Cecil, metoprolol was prescribed and an echocardiogram was ordered.      Their past medical, social and family history was reviewed.   ROS:   Please see the history of present illness.    All other systems reviewed and are negative.  EKGs/Labs/Other Studies Reviewed:    The following studies were reviewed today:  September 09, 2023 CTA coronary shows calcium score of 0  July 01, 2019 EKG shows sinus rhythm.  No preexcitation.  August 22, 2023 EKG shows SVT, narrow complex, rate 194 bpm         Physical Exam:    VS:  There were no vitals taken for this visit.    Wt Readings from Last 3 Encounters:  09/09/23 150 lb (68 kg)  08/25/23 158 lb 9.6 oz (71.9 kg)  08/22/23 145 lb (65.8 kg)     GEN: no distress CARD: RRR, No MRG RESP: No IWOB. CTAB.        ASSESSMENT AND PLAN:    No diagnosis found.  #SVT The patient has a history of SVT that is highly symptomatic with rapid ventricular rates approaching 200 bpm.  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  I discussed EP study and ablation with the patient.  I discussed other treatment modalities including antiarrhythmic drugs and nodal blockers.  After our discussion, the patient would like to proceed with EP study and ablation for possible definitive management of the arrhythmia.  She will hold her metoprolol for 5 days prior to procedure  Risk, benefits, and alternatives to EP study and radiofrequency ablation for SVT were also discussed in detail today. These risks include but are not limited to complete heart block, stroke, bleeding, vascular damage, tamponade, perforation, and death. The patient understands these risk and wishes to proceed.  We will therefore proceed with catheter ablation at the next available time.  Carto and ICE are requested for the procedure.  Will also obtain *** prior to the procedure.       Signed, Rossie Muskrat. Lalla Brothers, MD, [facility], [facility] 10/24/2023 6:20 PM    Electrophysiology [facility]

## 2023-10-25 ENCOUNTER — Ambulatory Visit: Payer: Medicaid Other | Admitting: Cardiology

## 2023-10-25 DIAGNOSIS — I471 Supraventricular tachycardia, unspecified: Secondary | ICD-10-CM

## 2023-11-01 ENCOUNTER — Ambulatory Visit: Payer: Medicaid Other | Attending: Cardiology | Admitting: Cardiology

## 2023-11-01 ENCOUNTER — Encounter: Payer: Self-pay | Admitting: Cardiology

## 2023-11-01 VITALS — BP 118/70 | HR 97 | Ht 66.0 in | Wt 150.0 lb

## 2023-11-01 DIAGNOSIS — I471 Supraventricular tachycardia, unspecified: Secondary | ICD-10-CM

## 2023-11-01 NOTE — Progress Notes (Signed)
 Electrophysiology Office Note:    Date:  11/01/2023   ID:  Bethany Castaneda, DOB [birth year], MRN [id number]  [facility] Cardiologist:  Debbe Odea, MD  [facility] Electrophysiologist:  Lanier Prude, MD   Referring MD: Debbe Odea, MD   Chief Complaint: SVT  History of Present Illness:    Bethany Castaneda is a 43 year old woman who I am seeing today for an evaluation of SVT at the request of Dr. Azucena Cecil.  The patient has a history of tobacco abuse, SVT, gout.  The patient last saw Dr. Azucena Cecil August 25, 2023.  That appointment was shortly after an emergency department visit for palpitations and right sided chest pain.  Her heart rate at the time was 180 bpm.  EKG in the emergency department showed a heart rate of 194 bpm.  Adenosine was given which converted her back to sinus rhythm.  At the appoint with Dr. Azucena Cecil, metoprolol was prescribed and an echocardiogram was ordered.  Today she confirms the above.  She tells me that since her severe episode that resulted in hospital presentation, she is experienced several shorter episodes of palpitations.  It is making her anxiety more severe.    Their past medical, social and family history was reviewed.   ROS:   Please see the history of present illness.    All other systems reviewed and are negative.  EKGs/Labs/Other Studies Reviewed:    The following studies were reviewed today:  September 09, 2023 CTA coronary shows calcium score of 0  July 01, 2019 EKG shows sinus rhythm.  No preexcitation.  August 22, 2023 EKG shows SVT, narrow complex, rate 194 bpm         Physical Exam:    VS:  BP 118/70   Pulse 97   Ht 5\' 6"  (1.676 m)   Wt 150 lb (68 kg)   SpO2 98%   BMI 24.21 kg/m     Wt Readings from Last 3 Encounters:  11/01/23 150 lb (68 kg)  09/09/23 150 lb (68 kg)  08/25/23 158 lb 9.6 oz (71.9 kg)     GEN: no distress CARD: RRR, No MRG RESP: No IWOB. CTAB.        ASSESSMENT  AND PLAN:    1. Supraventricular tachycardia (HCC)     #SVT The patient has a history of SVT that is highly symptomatic with rapid ventricular rates approaching 200 bpm.  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  I discussed EP study and ablation with the patient.  I discussed other treatment modalities including antiarrhythmic drugs and nodal blockers.  After our discussion, the patient would like to proceed with EP study and ablation for possible definitive management of the arrhythmia.  She will hold her metoprolol for 5 days prior to procedure  Therapeutic strategies for supraventricular tachycardia including medicine and ablation were discussed in detail with the patient today. Risk, benefits, and alternatives to EP study and radiofrequency ablation were also discussed in detail today. These risks include but are not limited to stroke, bleeding, vascular damage, tamponade, perforation, damage to the heart and other structures, AV block requiring pacemaker, worsening renal function, and death. The patient understands these risk and wishes to proceed.  We will therefore proceed with catheter ablation at the next available time.  She needs to complete her echo prior to the procedure.     Signed, Sheria Lang  Chalmers Cater, MD, [facility], [facility] 11/01/2023 3:32 PM    Electrophysiology [facility]

## 2023-11-01 NOTE — Patient Instructions (Addendum)
 Medication Instructions:  Your physician recommends that you continue on your current medications as directed. Please refer to the Current Medication list given to you today.  *If you need a refill on your cardiac medications before your next appointment, please call your pharmacy*  Lab Work: BMET and CBC - please have these done at the [facility] office or any [facility] location within 30 days of your procedure.   Testing/Procedures: Ablation Your physician has recommended that you have an ablation. Catheter ablation is a medical procedure used to treat some cardiac arrhythmias (irregular heartbeats). During catheter ablation, a long, thin, flexible tube is put into a blood vessel in your groin (upper thigh), or neck. This tube is called an ablation catheter. It is then guided to your heart through the blood vessel. Radio frequency waves destroy small areas of heart tissue where abnormal heartbeats may cause an arrhythmia to start.  You are scheduled for SVT Ablation on Thursday, May 1 with Dr. Steffanie Dunn.Please arrive at the Main Entrance A at [facility]: [address] [place], [place] [address] at 10:30 AM    Follow-Up: At [facility], you and your health needs are our priority.  As part of our continuing mission to provide you with exceptional heart care, we have created designated Provider Care Teams.  These Care Teams include your primary Cardiologist (physician) and Advanced Practice Providers (APPs -  Physician Assistants and Nurse Practitioners) who all work together to provide you with the care you need, when you need it.  Your next appointment:   4 weeks after your ablation

## 2023-11-03 ENCOUNTER — Encounter: Payer: Self-pay | Admitting: *Deleted

## 2023-11-07 ENCOUNTER — Ambulatory Visit: Payer: Medicaid Other | Admitting: Cardiology

## 2023-11-10 ENCOUNTER — Telehealth: Payer: Self-pay | Admitting: Cardiology

## 2023-11-10 NOTE — Telephone Encounter (Signed)
 Called patient, advised that the ECHO was just a heart ultrasound, advised that she was in pain some in her leg area and this may cause her HR to be up. Advised they could note this if needed, but this would look at how her heart was functioning. Patient verbalized understanding, thankful for call back

## 2023-11-10 NOTE — Telephone Encounter (Signed)
 Pt would like a c/b regarding whether pain from Sciatica Nerve will effect Echo. Please advise

## 2023-11-13 ENCOUNTER — Ambulatory Visit: Payer: Medicaid Other | Attending: Cardiology

## 2023-11-13 DIAGNOSIS — R072 Precordial pain: Secondary | ICD-10-CM

## 2023-11-13 DIAGNOSIS — I471 Supraventricular tachycardia, unspecified: Secondary | ICD-10-CM | POA: Diagnosis not present

## 2023-11-13 LAB — ECHOCARDIOGRAM COMPLETE: Area-P 1/2: 4.89 cm2

## 2023-11-13 MED ORDER — PERFLUTREN LIPID MICROSPHERE
1.0000 mL | INTRAVENOUS | Status: AC | PRN
  Administered 2023-11-13: 3 mL via INTRAVENOUS

## 2023-11-24 ENCOUNTER — Encounter: Payer: Self-pay | Admitting: *Deleted

## 2023-12-05 ENCOUNTER — Telehealth: Payer: Self-pay | Admitting: Cardiology

## 2023-12-05 NOTE — Telephone Encounter (Signed)
 Pt is suppose to have an MRI and wants to make sure it does not interfered with her ablation

## 2023-12-05 NOTE — Telephone Encounter (Signed)
 Spoke with patient and she states she has been having problems with her knee and they called to schedule MRI on may 9. Her ablation is scheduled for May 11 and she wanted to make sure the MRI wouldn't push her ablation back.  Informed patient she should be able to get MRI done on the 9th .

## 2023-12-06 NOTE — Telephone Encounter (Signed)
 Left message for patient to call back.  MRI will not interfere with her ablation.

## 2023-12-08 ENCOUNTER — Telehealth (HOSPITAL_COMMUNITY): Payer: Self-pay

## 2023-12-08 NOTE — Telephone Encounter (Signed)
 Attempted to reach patient to discuss upcoming procedure, no answer. Left VM for patient to return call.

## 2023-12-11 NOTE — Addendum Note (Signed)
 Addended by: Primitivo Gauze on: 12/11/2023 03:32 PM   Modules accepted: Orders

## 2023-12-11 NOTE — Telephone Encounter (Signed)
 Return call received from patient to discuss upcoming procedure.   Labs: plans to obtain on 12/13/23  Any recent signs of acute illness or been started on antibiotics? NO Any new medications started? Tramadol, Flexeril for sciatic pain Any medications to hold?  Metoprolol 5 days  Any missed doses of blood thinner? No Medication instructions:  On the morning of your procedure DO NOT take any medication. Nothing to eat or drink after midnight prior to your procedure.  Confirmed patient is scheduled for SVT Ablation on Friday, April 11 with Dr. Steffanie Dunn. Instructed patient to arrive at the Main Entrance A at [facility]: [address] [place], [place] [address] and check in at Admitting at 10:30 AM  Advised of plan to go home the same day and will only stay overnight if medically necessary. You MUST have a responsible adult to drive you home and MUST be with you the first 24 hours after you arrive home or your procedure could be cancelled.  Patient verbalized understanding to all instructions provided and agreed to proceed with procedure.

## 2023-12-13 ENCOUNTER — Other Ambulatory Visit (HOSPITAL_COMMUNITY): Payer: Self-pay

## 2023-12-13 ENCOUNTER — Other Ambulatory Visit: Admission: RE | Admit: 2023-12-13 | Source: Home / Self Care

## 2023-12-13 ENCOUNTER — Telehealth: Payer: Self-pay

## 2023-12-13 DIAGNOSIS — Z01812 Encounter for preprocedural laboratory examination: Secondary | ICD-10-CM

## 2023-12-13 DIAGNOSIS — I471 Supraventricular tachycardia, unspecified: Secondary | ICD-10-CM

## 2023-12-13 NOTE — Telephone Encounter (Signed)
 Beverly from the front office called Triage to have [place] release labs for this pt who was currently at [facility] to have labs drawn, however there were no labs to release. They had already been ordered and released. Pt was called and told to report to any [facility] to have labs drawn. Pt verbalized understanding. All questions, if any, were answered.

## 2023-12-14 NOTE — Anesthesia Preprocedure Evaluation (Signed)
 Anesthesia Evaluation  Patient identified by MRN, date of birth, ID band Patient awake    Reviewed: Allergy & Precautions, NPO status , Patient's Chart, lab work & pertinent test results  History of Anesthesia Complications Negative for: history of anesthetic complications  Airway Mallampati: III  TM Distance: >3 FB Neck ROM: Full    Dental  (+) Dental Advisory Given   Pulmonary neg shortness of breath, neg sleep apnea, neg COPD, neg recent URI, Current SmokerPatient did not abstain from smoking.   Pulmonary exam normal breath sounds clear to auscultation       Cardiovascular (-) hypertension(-) angina (-) Past MI, (-) Cardiac Stents and (-) CABG + dysrhythmias Supra Ventricular Tachycardia  Rhythm:Regular Rate:Normal  11/13/2023: IMPRESSIONS    1. Left ventricular ejection fraction, by estimation, is 50 to 55%. The  left ventricle has low normal function. The left ventricle has no regional  wall motion abnormalities. The left ventricular internal cavity size was  mildly dilated. Left ventricular  diastolic parameters were normal.   2. Right ventricular systolic function is normal. The right ventricular  size is normal. Tricuspid regurgitation signal is inadequate for assessing  PA pressure.   3. The mitral valve is normal in structure. No evidence of mitral valve  regurgitation. No evidence of mitral stenosis.   4. The aortic valve is normal in structure. Aortic valve regurgitation is  not visualized. No aortic stenosis is present.   5. The inferior vena cava is normal in size with greater than 50%  respiratory variability, suggesting right atrial pressure of 3 mmHg.     Neuro/Psych  Headaches, neg Seizures PSYCHIATRIC DISORDERS Anxiety     Chronic back pain    GI/Hepatic negative GI ROS, Neg liver ROS,,,  Endo/Other  negative endocrine ROS    Renal/GU negative Renal ROS     Musculoskeletal   Abdominal   Peds   Hematology negative hematology ROS (+) Lab Results      Component                Value               Date                      WBC                      10.5                09/09/2023                HGB                      17.4 (H)            09/09/2023                HCT                      49.6 (H)            09/09/2023                MCV                      97.6                09/09/2023  PLT                      336                 09/09/2023              Anesthesia Other Findings   Reproductive/Obstetrics                             Anesthesia Physical Anesthesia Plan  ASA: 2  Anesthesia Plan: MAC   Post-op Pain Management: Tylenol PO (pre-op)*   Induction: Intravenous  PONV Risk Score and Plan: 1 and Ondansetron, Dexamethasone, Treatment may vary due to age or medical condition, Propofol infusion and TIVA  Airway Management Planned: Natural Airway and Simple Face Mask  Additional Equipment:   Intra-op Plan:   Post-operative Plan:   Informed Consent: I have reviewed the patients History and Physical, chart, labs and discussed the procedure including the risks, benefits and alternatives for the proposed anesthesia with the patient or authorized representative who has indicated his/her understanding and acceptance.     Dental advisory given  Plan Discussed with: Anesthesiologist and CRNA  Anesthesia Plan Comments: (Risks of general anesthesia discussed including, but not limited to, sore throat, hoarse voice, chipped/damaged teeth, injury to vocal cords, nausea and vomiting, allergic reactions, lung infection, heart attack, stroke, and death. All questions answered. )       Anesthesia Quick Evaluation

## 2023-12-14 NOTE — Pre-Procedure Instructions (Signed)
Instructed patient on the following items: Arrival time 0800 Nothing to eat or drink after midnight No meds AM of procedure Responsible person to drive you home and stay with you for 24 hrs

## 2023-12-15 ENCOUNTER — Encounter (HOSPITAL_COMMUNITY): Payer: Self-pay | Admitting: Cardiology

## 2023-12-15 ENCOUNTER — Ambulatory Visit (HOSPITAL_COMMUNITY)
Admission: RE | Admit: 2023-12-15 | Discharge: 2023-12-15 | Disposition: A | Discharge status: Home / Self Care | Attending: Cardiology | Admitting: Cardiology

## 2023-12-15 ENCOUNTER — Ambulatory Visit (HOSPITAL_COMMUNITY): Payer: Self-pay | Admitting: Anesthesiology

## 2023-12-15 ENCOUNTER — Encounter (HOSPITAL_COMMUNITY)
Admission: RE | Disposition: A | Discharge status: Home / Self Care | Payer: Self-pay | Source: Home / Self Care | Attending: Cardiology

## 2023-12-15 ENCOUNTER — Other Ambulatory Visit: Payer: Self-pay

## 2023-12-15 ENCOUNTER — Ambulatory Visit (HOSPITAL_BASED_OUTPATIENT_CLINIC_OR_DEPARTMENT_OTHER): Payer: Self-pay | Admitting: Anesthesiology

## 2023-12-15 DIAGNOSIS — I4719 Other supraventricular tachycardia: Secondary | ICD-10-CM

## 2023-12-15 DIAGNOSIS — I471 Supraventricular tachycardia, unspecified: Secondary | ICD-10-CM

## 2023-12-15 DIAGNOSIS — M109 Gout, unspecified: Secondary | ICD-10-CM | POA: Diagnosis not present

## 2023-12-15 DIAGNOSIS — Z79899 Other long term (current) drug therapy: Secondary | ICD-10-CM | POA: Insufficient documentation

## 2023-12-15 HISTORY — PX: OTHER SURGICAL HISTORY: SHX169

## 2023-12-15 HISTORY — PX: SVT ABLATION: EP1225

## 2023-12-15 LAB — BASIC METABOLIC PANEL WITH GFR
Anion gap: 13 (ref 5–15)
BUN/Creatinine Ratio: 8 — ABNORMAL LOW (ref 9–23)
BUN: 5 mg/dL — ABNORMAL LOW (ref 6–24)
BUN: 5 mg/dL — ABNORMAL LOW (ref 6–20)
CO2: 22 mmol/L (ref 20–29)
CO2: 19 mmol/L — ABNORMAL LOW (ref 22–32)
Calcium: 8.8 mg/dL (ref 8.7–10.2)
Calcium: 8.6 mg/dL — ABNORMAL LOW (ref 8.9–10.3)
Chloride: 101 mmol/L (ref 96–106)
Chloride: 105 mmol/L (ref 98–111)
Creatinine, Ser: 0.62 mg/dL (ref 0.57–1.00)
Creatinine, Ser: 0.73 mg/dL (ref 0.44–1.00)
GFR, Estimated: >60 mL/min (ref >60)
Glucose, Bld: 108 mg/dL — ABNORMAL HIGH (ref 70–99)
Glucose: 116 mg/dL — ABNORMAL HIGH (ref 70–99)
Potassium: 4.7 mmol/L (ref 3.5–5.2)
Potassium: 3.6 mmol/L (ref 3.5–5.1)
Sodium: 140 mmol/L (ref 134–144)
Sodium: 137 mmol/L (ref 135–145)
eGFR: 114 mL/min/{1.73_m2} (ref >59)

## 2023-12-15 LAB — CBC
HCT: 47.3 % — ABNORMAL HIGH (ref 36.0–46.0)
Hematocrit: 46.1 % (ref 34.0–46.6)
Hemoglobin: 15.7 g/dL (ref 11.1–15.9)
Hemoglobin: 16.3 g/dL — ABNORMAL HIGH (ref 12.0–15.0)
MCH: 33.8 pg — ABNORMAL HIGH (ref 26.6–33.0)
MCH: 34.3 pg — ABNORMAL HIGH (ref 26.0–34.0)
MCHC: 34.1 g/dL (ref 31.5–35.7)
MCHC: 34.5 g/dL (ref 30.0–36.0)
MCV: 99 fL — ABNORMAL HIGH (ref 79–97)
MCV: 99.6 fL (ref 80.0–100.0)
Platelets: 348 10*3/uL (ref 150–450)
Platelets: 325 10*3/uL (ref 150–400)
RBC: 4.65 x10E6/uL (ref 3.77–5.28)
RBC: 4.75 MIL/uL (ref 3.87–5.11)
RDW: 13.4 % (ref 11.7–15.4)
RDW: 14.5 % (ref 11.5–15.5)
WBC: 7.1 10*3/uL (ref 3.4–10.8)
WBC: 15 10*3/uL — ABNORMAL HIGH (ref 4.0–10.5)
nRBC: 0 % (ref 0.0–0.2)

## 2023-12-15 LAB — PREGNANCY, URINE: Preg Test, Ur: NEGATIVE

## 2023-12-15 SURGERY — SVT ABLATION
Anesthesia: Monitor Anesthesia Care

## 2023-12-15 MED ORDER — ONDANSETRON HCL 4 MG/2ML IJ SOLN: 4.0000 mg | Freq: Four times a day (QID) | INTRAMUSCULAR | Status: DC | PRN

## 2023-12-15 MED ORDER — SODIUM CHLORIDE 0.9 % IV SOLN: INTRAVENOUS | Status: DC

## 2023-12-15 MED ORDER — HEPARIN SODIUM (PORCINE) 1000 UNIT/ML IJ SOLN
INTRAMUSCULAR | Status: AC
  Filled 2023-12-15: qty 10

## 2023-12-15 MED ORDER — ONDANSETRON HCL 4 MG/2ML IJ SOLN
INTRAMUSCULAR | Status: DC | PRN
  Administered 2023-12-15: 4 mg via INTRAVENOUS

## 2023-12-15 MED ORDER — ACETAMINOPHEN 500 MG PO TABS
1000.0000 mg | ORAL_TABLET | Freq: Once | ORAL | Status: AC
  Administered 2023-12-15: 1000 mg via ORAL
  Filled 2023-12-15: qty 2

## 2023-12-15 MED ORDER — LIDOCAINE 2% (20 MG/ML) 5 ML SYRINGE
INTRAMUSCULAR | Status: DC | PRN
  Administered 2023-12-15: 100 mg via INTRAVENOUS

## 2023-12-15 MED ORDER — PROPOFOL 10 MG/ML IV BOLUS
INTRAVENOUS | Status: DC | PRN
Start: 2023-12-15 — End: 2023-12-15
  Administered 2023-12-15: 100 mg via INTRAVENOUS
  Administered 2023-12-15: 30 mg via INTRAVENOUS
  Administered 2023-12-15: 20 mg via INTRAVENOUS
  Administered 2023-12-15: 50 mg via INTRAVENOUS

## 2023-12-15 MED ORDER — SODIUM CHLORIDE 0.9% FLUSH: 3.0000 mL | Freq: Two times a day (BID) | INTRAVENOUS | Status: DC

## 2023-12-15 MED ORDER — SODIUM CHLORIDE 0.9% FLUSH: 3.0000 mL | INTRAVENOUS | Status: DC | PRN

## 2023-12-15 MED ORDER — PROPOFOL 500 MG/50ML IV EMUL
INTRAVENOUS | Status: DC | PRN
  Administered 2023-12-15: 150 ug/kg/min via INTRAVENOUS

## 2023-12-15 MED ORDER — ISOPROTERENOL HCL 0.2 MG/ML IJ SOLN
INTRAVENOUS | Status: DC | PRN
  Administered 2023-12-15: 1 ug/min via INTRAVENOUS

## 2023-12-15 MED ORDER — ISOPROTERENOL HCL 0.2 MG/ML IJ SOLN
INTRAMUSCULAR | Status: AC
  Filled 2023-12-15: qty 5

## 2023-12-15 MED ORDER — ACETAMINOPHEN 325 MG PO TABS
ORAL_TABLET | ORAL | Status: AC
  Administered 2023-12-15: 650 mg via ORAL
  Filled 2023-12-15: qty 2

## 2023-12-15 MED ORDER — ACETAMINOPHEN 325 MG PO TABS: 650.0000 mg | ORAL_TABLET | ORAL | Status: DC | PRN

## 2023-12-15 MED ORDER — ROCURONIUM BROMIDE 10 MG/ML (PF) SYRINGE
PREFILLED_SYRINGE | INTRAVENOUS | Status: DC | PRN
  Administered 2023-12-15: 20 mg via INTRAVENOUS
  Administered 2023-12-15: 60 mg via INTRAVENOUS

## 2023-12-15 MED ORDER — SUGAMMADEX SODIUM 200 MG/2ML IV SOLN
INTRAVENOUS | Status: DC | PRN
  Administered 2023-12-15: 200 mg via INTRAVENOUS

## 2023-12-15 MED ORDER — HEPARIN SODIUM (PORCINE) 1000 UNIT/ML IJ SOLN
INTRAMUSCULAR | Status: AC
  Filled 2023-12-15: qty 1

## 2023-12-15 MED ORDER — SODIUM CHLORIDE 0.9 % IV SOLN: 250.0000 mL | INTRAVENOUS | Status: DC | PRN

## 2023-12-15 MED ORDER — DEXAMETHASONE SODIUM PHOSPHATE 10 MG/ML IJ SOLN
INTRAMUSCULAR | Status: DC | PRN
  Administered 2023-12-15: 10 mg via INTRAVENOUS

## 2023-12-15 SURGICAL SUPPLY — 10 items
CATH CRD2 QUAD 6FR REP (CATHETERS) IMPLANT
CATH JOSEPH QUAD ALLRED 6F REP (CATHETERS) IMPLANT
CATH SMTCH THERMOCOOL SF DF (CATHETERS) IMPLANT
CLOSURE PERCLOSE PROSTYLE (VASCULAR PRODUCTS) IMPLANT
PACK EP LF (CUSTOM PROCEDURE TRAY) ×1 IMPLANT
PAD DEFIB RADIO PHYSIO CONN (PAD) ×1 IMPLANT
SHEATH CARTO VIZIGO SM CVD (SHEATH) IMPLANT
SHEATH PINNACLE 7F 10CM (SHEATH) IMPLANT
SHEATH PINNACLE 8F 10CM (SHEATH) IMPLANT
TUBING SMART ABLATE COOLFLOW (TUBING) IMPLANT

## 2023-12-15 NOTE — Anesthesia Postprocedure Evaluation (Signed)
 Anesthesia Post Note  Patient: Bethany Castaneda  Procedure(s) Performed: SVT ABLATION     Patient location during evaluation: PACU Anesthesia Type: General Level of consciousness: awake Pain management: pain level controlled Vital Signs Assessment: post-procedure vital signs reviewed and stable Respiratory status: spontaneous breathing, nonlabored ventilation and respiratory function stable Cardiovascular status: blood pressure returned to baseline and stable Postop Assessment: no apparent nausea or vomiting Anesthetic complications: no   There were no known notable events for this encounter.  Last Vitals:  Vitals:   12/15/23 1245 12/15/23 1301  BP: 138/89 121/86  Pulse: 88 83  Resp: 14 (!) 9  Temp:    SpO2: 96% 96%    Last Pain:  Vitals:   12/15/23 1245  TempSrc:   PainSc: 8                  Linton Rump

## 2023-12-15 NOTE — Discharge Instructions (Signed)

## 2023-12-15 NOTE — H&P (Signed)
 Electrophysiology Office Note:     Date:  12/15/2023    ID:  Bethany Castaneda, DOB [birth year], MRN [id number]   [facility] Cardiologist:  Debbe Odea, MD  [facility] Electrophysiologist:  Lanier Prude, MD    Referring MD: Debbe Odea, MD    Chief Complaint: SVT   History of Present Illness:     Bethany Castaneda is a 43 year old woman who I am seeing today for an evaluation of SVT at the request of Dr. Azucena Cecil.  The patient has a history of tobacco abuse, SVT, gout.  The patient last saw Dr. Azucena Cecil August 25, 2023.  That appointment was shortly after an emergency department visit for palpitations and right sided chest pain.  Her heart rate at the time was 180 bpm.  EKG in the emergency department showed a heart rate of 194 bpm.  Adenosine was given which converted her back to sinus rhythm.  At the appoint with Dr. Azucena Cecil, metoprolol was prescribed and an echocardiogram was ordered.   Today she confirms the above.  She tells me that since her severe episode that resulted in hospital presentation, she is experienced several shorter episodes of palpitations.  It is making her anxiety more severe.  Presents for EP study and possible SVT ablation. Procedure reviewed.   Objective Their past medical, social and family history was reviewed.     ROS:   Please see the history of present illness.    All other systems reviewed and are negative.   EKGs/Labs/Other Studies Reviewed:     The following studies were reviewed today:   September 09, 2023 CTA coronary shows calcium score of 0   July 01, 2019 EKG shows sinus rhythm.  No preexcitation.   August 22, 2023 EKG shows SVT, narrow complex, rate 194 bpm              Physical Exam:     VS:  BP 139/91   Pulse 92   Ht 5\' 6"  (1.676 m)   Wt 150 lb (68 kg)   SpO2 98%   BMI 24.21 kg/m         Wt Readings from Last 3 Encounters:  11/01/23 150 lb (68 kg)  09/09/23 150 lb (68 kg)  08/25/23 158  lb 9.6 oz (71.9 kg)      GEN: no distress CARD: RRR, No MRG RESP: No IWOB. CTAB.         Assessment ASSESSMENT AND PLAN:     1. Supraventricular tachycardia (HCC)       #SVT The patient has a history of SVT that is highly symptomatic with rapid ventricular rates approaching 200 bpm.  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  I discussed EP study and ablation with the patient.  I discussed other treatment modalities including antiarrhythmic drugs and nodal blockers.  After our discussion, the patient would like to proceed with EP study and ablation for possible definitive management of the arrhythmia.   She will hold her metoprolol for 5 days prior to procedure   Therapeutic strategies for supraventricular tachycardia including medicine and ablation were discussed in detail with the patient today. Risk, benefits, and alternatives to EP study and radiofrequency ablation were also discussed in detail today. These risks include but are not limited to stroke, bleeding, vascular damage, tamponade, perforation, damage to the heart and other structures, AV block requiring pacemaker,  worsening renal function, and death. The patient understands these risk and wishes to proceed.  We will therefore proceed with catheter ablation at the next available time.   Presents for EP study and possible SVT ablation. Procedure reviewed.     Signed, Rossie Muskrat. Lalla Brothers, MD, [facility], [facility] 12/15/2023 Electrophysiology [facility]

## 2023-12-15 NOTE — Progress Notes (Signed)
Pt ambulated to and from bathroom to void with no signs of oozing from right groin site  

## 2023-12-15 NOTE — Transfer of Care (Signed)
 Immediate Anesthesia Transfer of Care Note  Patient: Bethany Castaneda  Procedure(s) Performed: SVT ABLATION  Patient Location: PACU and Cath Lab  Anesthesia Type:General  Level of Consciousness: awake, alert , and oriented  Airway & Oxygen Therapy: Patient Spontanous Breathing  Post-op Assessment: Report given to RN and Post -op Vital signs reviewed and stable  Post vital signs: Reviewed and stable  Last Vitals:  Vitals Value Taken Time  BP 131/90 12/15/23 1155  Temp    Pulse 96 12/15/23 1156  Resp 23 12/15/23 1156  SpO2 93 % 12/15/23 1156  Vitals shown include unfiled device data.  Last Pain:  Vitals:   12/15/23 0823  TempSrc:   PainSc: 0-No pain         Complications: There were no known notable events for this encounter.

## 2023-12-15 NOTE — Anesthesia Procedure Notes (Signed)
 Procedure Name: Intubation Date/Time: 12/15/2023 10:02 AM  Performed by: Alwyn Ren, CRNAPre-anesthesia Checklist: Patient identified, Emergency Drugs available, Suction available and Patient being monitored Patient Re-evaluated:Patient Re-evaluated prior to induction Oxygen Delivery Method: Circle system utilized Preoxygenation: Pre-oxygenation with 100% oxygen Induction Type: IV induction Ventilation: Mask ventilation without difficulty Laryngoscope Size: Miller and 2 Grade View: Grade I Tube type: Oral Tube size: 7.0 mm Number of attempts: 1 Airway Equipment and Method: Stylet and Oral airway Placement Confirmation: ETT inserted through vocal cords under direct vision, positive ETCO2 and breath sounds checked- equal and bilateral Secured at: 22 cm Tube secured with: Tape Dental Injury: Teeth and Oropharynx as per pre-operative assessment

## 2023-12-18 ENCOUNTER — Telehealth (HOSPITAL_COMMUNITY): Payer: Self-pay

## 2023-12-18 NOTE — Telephone Encounter (Signed)
 Attempted to reach patient to follow up with procedure completed on 12/15/23, no answer. Left VM for patient to return call.

## 2023-12-19 MED FILL — Heparin Sodium (Porcine) Inj 1000 Unit/ML: INTRAMUSCULAR | Qty: 10 | Status: AC

## 2023-12-20 ENCOUNTER — Other Ambulatory Visit: Payer: Self-pay

## 2023-12-20 ENCOUNTER — Emergency Department

## 2023-12-20 ENCOUNTER — Emergency Department
Admission: EM | Admit: 2023-12-20 | Discharge: 2023-12-20 | Disposition: A | Discharge status: Home / Self Care | Attending: Emergency Medicine | Admitting: Emergency Medicine

## 2023-12-20 ENCOUNTER — Telehealth: Payer: Self-pay | Admitting: Cardiology

## 2023-12-20 DIAGNOSIS — F419 Anxiety disorder, unspecified: Secondary | ICD-10-CM | POA: Insufficient documentation

## 2023-12-20 DIAGNOSIS — R002 Palpitations: Secondary | ICD-10-CM

## 2023-12-20 DIAGNOSIS — H538 Other visual disturbances: Secondary | ICD-10-CM | POA: Diagnosis not present

## 2023-12-20 DIAGNOSIS — R079 Chest pain, unspecified: Secondary | ICD-10-CM | POA: Diagnosis not present

## 2023-12-20 DIAGNOSIS — R0602 Shortness of breath: Secondary | ICD-10-CM | POA: Diagnosis not present

## 2023-12-20 DIAGNOSIS — F17200 Nicotine dependence, unspecified, uncomplicated: Secondary | ICD-10-CM | POA: Insufficient documentation

## 2023-12-20 DIAGNOSIS — M546 Pain in thoracic spine: Secondary | ICD-10-CM | POA: Diagnosis not present

## 2023-12-20 LAB — BASIC METABOLIC PANEL WITH GFR
Anion gap: 7 (ref 5–15)
BUN: 6 mg/dL (ref 6–20)
CO2: 22 mmol/L (ref 22–32)
Calcium: 8.6 mg/dL — ABNORMAL LOW (ref 8.9–10.3)
Chloride: 105 mmol/L (ref 98–111)
Creatinine, Ser: 0.71 mg/dL (ref 0.44–1.00)
GFR, Estimated: >60 mL/min (ref >60)
Glucose, Bld: 143 mg/dL — ABNORMAL HIGH (ref 70–99)
Potassium: 3.7 mmol/L (ref 3.5–5.1)
Sodium: 134 mmol/L — ABNORMAL LOW (ref 135–145)

## 2023-12-20 LAB — CBC
HCT: 44.7 % (ref 36.0–46.0)
Hemoglobin: 15.4 g/dL — ABNORMAL HIGH (ref 12.0–15.0)
MCH: 34.1 pg — ABNORMAL HIGH (ref 26.0–34.0)
MCHC: 34.5 g/dL (ref 30.0–36.0)
MCV: 99.1 fL (ref 80.0–100.0)
Platelets: 330 10*3/uL (ref 150–400)
RBC: 4.51 MIL/uL (ref 3.87–5.11)
RDW: 14 % (ref 11.5–15.5)
WBC: 11.8 10*3/uL — ABNORMAL HIGH (ref 4.0–10.5)
nRBC: 0 % (ref 0.0–0.2)

## 2023-12-20 LAB — TROPONIN I (HIGH SENSITIVITY)
Troponin I (High Sensitivity): 27 ng/L — ABNORMAL HIGH (ref <18)
Troponin I (High Sensitivity): 28 ng/L — ABNORMAL HIGH (ref <18)

## 2023-12-20 NOTE — ED Notes (Signed)
 Dr. Alejo Amsler, MD at bedside for evaluation

## 2023-12-20 NOTE — Telephone Encounter (Signed)
  STAT if HR is under 50 or over 120(normal HR is 60-100 beats per minute)  What is your heart rate? 96   Do you have a log of your heart rate readings (document readings)? Only took once   Do you have any other symptoms? Dizzy, nausea, blurred vision

## 2023-12-20 NOTE — ED Triage Notes (Addendum)
 Pt arrives via POV with c/o chest tightness, racing heart, tightness/pressure across top of shoulders, and a pinching pain in left arm which started around 0930 this morning. Pt had an ablation last Friday around 1000 nd has been feeling fine since then. Pt is A&Ox4 during triage. Pt is visibly anxious and shaky. Pt endorses nausea.

## 2023-12-20 NOTE — ED Provider Notes (Signed)
 [facility] Provider Note   Event Date/Time   First MD Initiated Contact with Patient 12/20/23 1047     (approximate) History  Post-op Problem and Chest Pain  HPI Bethany Castaneda is a 43 y.o. female with a past medical history of tobacco abuse and recent history of of SVT ablation on 12/15/23 with Dr. Lalla Brothers who presents today complaining of an episode of palpitations, back pain, shortness of breath, and intermittent blurry vision with associated anxiety.  Patient states that she has had panic attacks in the past that feels similar to this however with the recent ablation procedure she was concerned that this may be more severe.  Patient states that since she has been in our emergency department, her symptoms have almost completely resolved. ROS: Patient currently denies any tinnitus, difficulty speaking, facial droop, sore throat, abdominal pain, nausea/vomiting/diarrhea, dysuria, or weakness/numbness/paresthesias in any extremity   Physical Exam  Triage Vital Signs: ED Triage Vitals  Encounter Vitals Group     BP 12/20/23 1039 (!) 137/105     Systolic BP Percentile --      Diastolic BP Percentile --      Pulse Rate 12/20/23 1039 93     Resp 12/20/23 1039 18     Temp 12/20/23 1039 98 F (36.7 C)     Temp Source 12/20/23 1039 Oral     SpO2 12/20/23 1039 97 %     Weight 12/20/23 1042 160 lb (72.6 kg)     Height 12/20/23 1042 5\' 6"  (1.676 m)     Head Circumference --      Peak Flow --      Pain Score 12/20/23 1040 8     Pain Loc --      Pain Education --      Exclude from Growth Chart --    Most recent vital signs: Vitals:   12/20/23 1039 12/20/23 1518  BP: (!) 137/105 129/89  Pulse: 93 77  Resp: 18 18  Temp: 98 F (36.7 C) 97.7 F (36.5 C)  SpO2: 97% 97%   General: Awake, oriented x4. CV:  Good peripheral perfusion.  Resp:  Normal effort.  Abd:  No distention.  Other:  Middle-aged overweight Caucasian female resting comfortably in no acute  distress ED Results / Procedures / Treatments  Labs (all labs ordered are listed, but only abnormal results are displayed) Labs Reviewed  BASIC METABOLIC PANEL WITH GFR - Abnormal; Notable for the following components:      Result Value   Sodium 134 (*)    Glucose, Bld 143 (*)    Calcium 8.6 (*)    All other components within normal limits  CBC - Abnormal; Notable for the following components:   WBC 11.8 (*)    Hemoglobin 15.4 (*)    MCH 34.1 (*)    All other components within normal limits  TROPONIN I (HIGH SENSITIVITY) - Abnormal; Notable for the following components:   Troponin I (High Sensitivity) 27 (*)    All other components within normal limits  TROPONIN I (HIGH SENSITIVITY) - Abnormal; Notable for the following components:   Troponin I (High Sensitivity) 28 (*)    All other components within normal limits   EKG ED ECG REPORT I, Merwyn Katos, the attending physician, personally viewed and interpreted this ECG. Date: 12/20/2023 EKG Time: 1046 Rate: 100 Rhythm: Tachycardic sinus rhythm QRS Axis: normal Intervals: normal ST/T Wave abnormalities: normal Narrative Interpretation: Tachycardic sinus rhythm.  No evidence of  acute ischemia RADIOLOGY ED MD interpretation: One-view portable chest x-ray interpreted by me shows no evidence of acute abnormalities including no pneumonia, pneumothorax, or widened mediastinum -Agree with radiology assessment Official radiology report(s): No results found. PROCEDURES: Critical Care performed: No Procedures MEDICATIONS ORDERED IN ED: Medications - No data to display IMPRESSION / MDM / ASSESSMENT AND PLAN / ED COURSE  I reviewed the triage vital signs and the nursing notes.                             The patient is on the cardiac monitor to evaluate for evidence of arrhythmia and/or significant heart rate changes. Patient's presentation is most consistent with acute presentation with potential threat to life or bodily  function. This patient presents with symptoms consistent with acute anxiety reaction / panic attack.  Given negative cardiac workup including nonischemic EKG and negative troponin, low suspicion for acute cardiopulmonary process including ACS, PE, or thoracic aortic dissection. Denies any ingestions or any other medical complaints. No evidence of alcohol withdrawal symptoms. Presentation not consistent with overt toxidrome, ingestion given history & physical. Presentation not consistent with organic or medical emergency at this time. No acute indication for psychiatric consultation (without SI/HI, AH/VH). Cautious return precautions discussed with full understanding.  Plan: Psych follow up PRN Dispo: Discharge   FINAL CLINICAL IMPRESSION(S) / ED DIAGNOSES   Final diagnoses:  Palpitations  Chest pain, unspecified type  Acute midline thoracic back pain  Anxiety   Rx / DC Orders   ED Discharge Orders          Ordered    Ambulatory referral to Cardiology       Comments: If you have not heard from the Cardiology office within the next 72 hours please call [phone].   12/20/23 1444           Note:  This document was prepared using Dragon voice recognition software and may include unintentional dictation errors.   Taeja Debellis K, MD 12/21/23 [phone]

## 2023-12-20 NOTE — Telephone Encounter (Signed)
 Patient with complaint of feeling like her heart racing, pain in upper back and left arm, nausea. Patient reports symptoms times one hour. Patient reports heart rate of 96. Patient unable to check blood pressure at this time. Patient states that she is on her way to ED. Advised patient that based on her symptoms patient should go to the ED. Patient verbalizes understanding.

## 2023-12-22 ENCOUNTER — Ambulatory Visit: Attending: Cardiology | Admitting: Cardiology

## 2024-01-12 ENCOUNTER — Ambulatory Visit

## 2024-01-12 ENCOUNTER — Ambulatory Visit: Attending: Cardiology | Admitting: Cardiology

## 2024-01-12 ENCOUNTER — Encounter: Payer: Self-pay | Admitting: Cardiology

## 2024-01-12 VITALS — BP 145/80 | HR 111 | Ht 66.0 in | Wt 155.4 lb

## 2024-01-12 DIAGNOSIS — R002 Palpitations: Secondary | ICD-10-CM

## 2024-01-12 DIAGNOSIS — I471 Supraventricular tachycardia, unspecified: Secondary | ICD-10-CM

## 2024-01-12 DIAGNOSIS — Z720 Tobacco use: Secondary | ICD-10-CM

## 2024-01-12 DIAGNOSIS — F419 Anxiety disorder, unspecified: Secondary | ICD-10-CM | POA: Diagnosis present

## 2024-01-12 NOTE — Patient Instructions (Signed)
 Medication Instructions:  The current medical regimen is effective;  continue present plan and medications.  *If you need a refill on your cardiac medications before your next appointment, please call your pharmacy*  Testing/Procedures:  ZIO XT- Long Term Monitor Instructions  Your physician has requested you wear a ZIO patch monitor for 7 days.  This is a single patch monitor. [employer] supplies one patch monitor per enrollment. Additional stickers are not available. Please do not apply patch if you will be having a Nuclear Stress Test,  Echocardiogram, Cardiac CT, MRI, or Chest Xray during the period you would be wearing the  monitor. The patch cannot be worn during these tests. You cannot remove and re-apply the  ZIO XT patch monitor.  Your ZIO patch monitor will be mailed 3 day USPS to your address on file. It may take 3-5 days  to receive your monitor after you have been enrolled.  Once you have received your monitor, please review the enclosed instructions. Your monitor  has already been registered assigning a specific monitor serial # to you.  Billing and Patient Assistance Program Information  We have supplied [employer] with any of your insurance information on file for billing purposes. [employer] offers a sliding scale Patient Assistance Program for patients that do not have  insurance, or whose insurance does not completely cover the cost of the ZIO monitor.  You must apply for the Patient Assistance Program to qualify for this discounted rate.  To apply, please call [employer] at [phone], select option 4, select option 2, ask to apply for  Patient Assistance Program. Sanna Crystal will ask your household income, and how many people  are in your household. They will quote your out-of-pocket cost based on that information.  [employer] will also be able to set up a 71-month, interest-free payment plan if needed.  Applying the monitor   Shave hair from upper left chest.  Hold abrader disc  by orange tab. Rub abrader in 40 strokes over the upper left chest as  indicated in your monitor instructions.  Clean area with 4 enclosed alcohol pads. Let dry.  Apply patch as indicated in monitor instructions. Patch will be placed under collarbone on left  side of chest with arrow pointing upward.  Rub patch adhesive wings for 2 minutes. Remove white label marked "1". Remove the white  label marked "2". Rub patch adhesive wings for 2 additional minutes.  While looking in a mirror, press and release button in center of patch. A small green light will  flash 3-4 times. This will be your only indicator that the monitor has been turned on.  Do not shower for the first 24 hours. You may shower after the first 24 hours.  Press the button if you feel a symptom. You will hear a small click. Record Date, Time and  Symptom in the Patient Logbook.  When you are ready to remove the patch, follow instructions on the last 2 pages of Patient  Logbook. Stick patch monitor onto the last page of Patient Logbook.  Place Patient Logbook in the blue and white box. Use locking tab on box and tape box closed  securely. The blue and white box has prepaid postage on it. Please place it in the mailbox as  soon as possible. Your physician should have your test results approximately 7 days after the  monitor has been mailed back to [facility].  Call [facility] Customer Care at [phone] if you have questions regarding  your ZIO XT  patch monitor. Call them immediately if you see an orange light blinking on your  monitor.  If your monitor falls off in less than 4 days, contact our Monitor department at [phone].  If your monitor becomes loose or falls off after 4 days call [employer] at [phone] for  suggestions on securing your monitor   Follow-Up: At [facility], you and your health needs are our priority.  As part of our continuing mission to provide you with exceptional heart care,  our providers are all part of one team.  This team includes your primary Cardiologist (physician) and Advanced Practice Providers or APPs (Physician Assistants and Nurse Practitioners) who all work together to provide you with the care you need, when you need it.  Your next appointment:   6 month(s)  Provider:   Harvie Liner, MD or Suzann Riddle, NP    We recommend signing up for the patient portal called "MyChart".  Sign up information is provided on this After Visit Summary.  MyChart is used to connect with patients for Virtual Visits (Telemedicine).  Patients are able to view lab/test results, encounter notes, upcoming appointments, etc.  Non-urgent messages can be sent to your provider as well.   To learn more about what you can do with MyChart, go to [email or web address].   Other Instructions See primary care provider  Stop smoking

## 2024-01-12 NOTE — Progress Notes (Signed)
 Electrophysiology Clinic Note    Date:  01/12/2024  Patient ID:  Bethany, Castaneda 07/20/1981, MRN [id number] PCP:  Dionicia Frater, MD  Cardiologist:  Constancia Delton, MD Electrophysiologist: Boyce Byes, MD   Discussed the use of AI scribe software for clinical note transcription with the patient, who gave verbal consent to proceed.   Patient Profile    Chief Complaint: SVT ablation follow-up  History of Present Illness: Bethany Castaneda is a 43 y.o. female with PMH notable for SVT, tobacco use, gout; seen today for Boyce Byes, MD for routine electrophysiology followup.  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 Marven Slimmer  She presented to [facility] ER 12/15/2023 with palpitations, SOB, back pain. EKG in sinus rhythm. No intervention provided.  On follow-up today, she states that she feels worse than she did prior to the ablation. She is having daily palpitations and anxiety. She has a tightness in both shoulders, a new twitch in her R foot. These episodes are more severe than her pre-ablation tachycardia. Her anxiety, previously managed with Xanax several years ago, has intensified since the procedure, often requiring her to lie down. She experiences difficulty sleeping due to anxiety but no shortness of breath.  She is having significant life stressors include challenges with her daughter and living with her partner's mother, but these are not different since the ablation procedure. She smokes and is motivated to quit.     Arrhythmia/Device History No specialty comments available.    ROS:  Please see the history of present illness. All other systems are reviewed and otherwise negative.    Physical Exam    VS:  BP (!) 145/80 (BP Location: Left Arm, Patient Position: Sitting, Cuff Size: Normal)   Pulse (!) 111   Ht 5\' 6"  (1.676 m)   Wt 155 lb 6.4 oz (70.5 kg)   LMP  (LMP Unknown)   SpO2 98%   BMI 25.08 kg/m  BMI: Body mass index  is 25.08 kg/m.  Wt Readings from Last 3 Encounters:  01/12/24 155 lb 6.4 oz (70.5 kg)  12/20/23 160 lb (72.6 kg)  12/15/23 155 lb (70.3 kg)     GEN- Anxious-appearing with visible shaking, alert and oriented x 3 today. Teary. Lungs- Clear to ausculation bilaterally, normal work of breathing.  Heart- Regular, tachycardic rate and rhythm, no murmurs, rubs or gallops Extremities- Trace peripheral edema, warm, dry    Studies Reviewed   Previous EP, cardiology notes.    EKG is ordered. Personal review of EKG from today shows:    EKG Interpretation Date/Time:  Friday Jan 12 2024 13:35:07 EDT Ventricular Rate:  111 PR Interval:  118 QRS Duration:  72 QT Interval:  328 QTC Calculation: 446 R Axis:   43  Text Interpretation: Sinus tachycardia Confirmed by Aerabella Galasso [phone]) on 01/12/2024 2:09:43 PM    TTE, 11/13/2023 1. Left ventricular ejection fraction, by estimation, is 50 to 55%. The left ventricle has low normal function. The left ventricle has no regional wall motion abnormalities. The left ventricular internal cavity size was mildly dilated. Left ventricular diastolic parameters were normal.   2. Right ventricular systolic function is normal. The right ventricular size is normal. Tricuspid regurgitation signal is inadequate for assessing PA pressure.   3. The mitral valve is normal in structure. No evidence of mitral valve regurgitation. No evidence of mitral stenosis.   4. The aortic valve is normal in structure. Aortic valve regurgitation  is not visualized. No aortic stenosis is present.   5. The inferior vena cava is normal in size with greater than 50% respiratory variability, suggesting right atrial pressure of 3 mmHg.   Coronary CTA, 09/04/2023 1. Coronary calcium score of 0.  2. Normal coronary origin with left dominance.  3. No evidence of CAD.  4. CAD-RADS 0. Consider non-atherosclerotic causes of chest pain   Assessment and Plan     #) SVT #)  palpitations #) anxiety S/p AVNRT ablation EKG today with sinus tach She is having significant palpitation and anxiety Update 2 week monitor to confirm no SVT episodes Continue 25mg  toprol  Recommended she discuss anxiety mgmt with PCP   #) tobacco abuse Recommended tobacco reduction, ideally cessation She and her partner are motivated to quit       Current medicines are reviewed at length with the patient today.   The patient has concerns regarding her medicines.  The following changes were made today:  none  Labs/ tests ordered today include:  Orders Placed This Encounter  Procedures   LONG TERM MONITOR (3-14 DAYS)   EKG 12-Lead     Disposition: Follow up with Dr. Marven Slimmer or EP APP in 6 months, sooner if needed   Signed, Leandrew Keech, NP  01/12/24  2:23 PM  Electrophysiology [facility]

## 2024-01-23 ENCOUNTER — Emergency Department

## 2024-01-23 ENCOUNTER — Emergency Department
Admission: EM | Admit: 2024-01-23 | Discharge: 2024-01-23 | Disposition: A | Discharge status: Home / Self Care | Attending: Emergency Medicine | Admitting: Emergency Medicine

## 2024-01-23 ENCOUNTER — Other Ambulatory Visit: Payer: Self-pay

## 2024-01-23 DIAGNOSIS — R059 Cough, unspecified: Secondary | ICD-10-CM | POA: Diagnosis present

## 2024-01-23 DIAGNOSIS — M546 Pain in thoracic spine: Secondary | ICD-10-CM | POA: Diagnosis not present

## 2024-01-23 DIAGNOSIS — J40 Bronchitis, not specified as acute or chronic: Secondary | ICD-10-CM

## 2024-01-23 HISTORY — DX: Supraventricular tachycardia, unspecified: I47.10

## 2024-01-23 LAB — BASIC METABOLIC PANEL WITH GFR
Anion gap: 14 (ref 5–15)
BUN: <5 mg/dL — ABNORMAL LOW (ref 6–20)
CO2: 20 mmol/L — ABNORMAL LOW (ref 22–32)
Calcium: 8.5 mg/dL — ABNORMAL LOW (ref 8.9–10.3)
Chloride: 101 mmol/L (ref 98–111)
Creatinine, Ser: 0.66 mg/dL (ref 0.44–1.00)
GFR, Estimated: >60 mL/min (ref >60)
Glucose, Bld: 117 mg/dL — ABNORMAL HIGH (ref 70–99)
Potassium: 3.6 mmol/L (ref 3.5–5.1)
Sodium: 135 mmol/L (ref 135–145)

## 2024-01-23 LAB — RESP PANEL BY RT-PCR (RSV, FLU A&B, COVID)  RVPGX2
Influenza A by PCR: NEGATIVE
Influenza B by PCR: NEGATIVE
Resp Syncytial Virus by PCR: NEGATIVE
SARS Coronavirus 2 by RT PCR: NEGATIVE

## 2024-01-23 LAB — GROUP A STREP BY PCR: Group A Strep by PCR: NOT DETECTED

## 2024-01-23 LAB — CBC
HCT: 44 % (ref 36.0–46.0)
Hemoglobin: 15.5 g/dL — ABNORMAL HIGH (ref 12.0–15.0)
MCH: 34.3 pg — ABNORMAL HIGH (ref 26.0–34.0)
MCHC: 35.2 g/dL (ref 30.0–36.0)
MCV: 97.3 fL (ref 80.0–100.0)
Platelets: 277 10*3/uL (ref 150–400)
RBC: 4.52 MIL/uL (ref 3.87–5.11)
RDW: 14.3 % (ref 11.5–15.5)
WBC: 8.3 10*3/uL (ref 4.0–10.5)
nRBC: 0 % (ref 0.0–0.2)

## 2024-01-23 LAB — TROPONIN I (HIGH SENSITIVITY): Troponin I (High Sensitivity): 3 ng/L (ref <18)

## 2024-01-23 MED ORDER — ALBUTEROL SULFATE HFA 108 (90 BASE) MCG/ACT IN AERS: 2.0000 | INHALATION_SPRAY | Freq: Four times a day (QID) | RESPIRATORY_TRACT | 2 refills | Status: AC | PRN

## 2024-01-23 MED ORDER — BENZONATATE 100 MG PO CAPS: 100.0000 mg | ORAL_CAPSULE | Freq: Three times a day (TID) | ORAL | 0 refills | Status: DC | PRN

## 2024-01-23 MED ORDER — PREDNISONE 20 MG PO TABS: 40.0000 mg | ORAL_TABLET | Freq: Every day | ORAL | 0 refills | Status: AC

## 2024-01-23 MED ORDER — DOXYCYCLINE HYCLATE 100 MG PO TABS: 100.0000 mg | ORAL_TABLET | Freq: Two times a day (BID) | ORAL | 0 refills | Status: AC

## 2024-01-23 NOTE — ED Triage Notes (Signed)
 Pt to ED with husband for cough, sore throat, and upper back pain to both sides since 4 days ago. Pt states also it feels like something is vibrating in her belly and R leg. Pt is hoarse. Also had ablation for SVT in April. Husband concerned that pt has been hypertensive at home. Pt is pending Holter monitor from cardiologist. When asked, pt also endorses chest pain under breasts.

## 2024-01-23 NOTE — ED Provider Notes (Addendum)
 [facility] Provider Note    Event Date/Time   First MD Initiated Contact with Patient 01/23/24 1953     (approximate)   History   Back Pain, Sore Throat, and Chest Pain   HPI  Bethany Castaneda is a 43 y.o. female who comes in for cough, sore throat, upper back pain for the past 4 days.  Patient does report having history of an ablation for SVT in April.  Patient reports that [place] the [place].  She denies using her voice a lot but on Saturday she started having a cough, hoarse voice, congestion, pain with coughing.  Given her history of SVT and she was having some pain in her chest from the coughing she wanted to come into the emergency room to be evaluated.  She denies any risk factors for PE but does have an IUD.  She denies any swelling her legs, calf swelling.  She denies that the chest pain is radiating.  She reports that the pain is just when she is coughing .  Denies really any pain at rest.   Physical Exam   Triage Vital Signs: ED Triage Vitals  Encounter Vitals Group     BP 01/23/24 1704 (!) 172/109     Systolic BP Percentile --      Diastolic BP Percentile --      Pulse Rate 01/23/24 1704 98     Resp 01/23/24 1704 20     Temp 01/23/24 1704 98.2 F (36.8 C)     Temp Source 01/23/24 1704 Oral     SpO2 01/23/24 1704 99 %     Weight 01/23/24 1707 155 lb (70.3 kg)     Height 01/23/24 1707 5\' 6"  (1.676 m)     Head Circumference --      Peak Flow --      Pain Score 01/23/24 1701 7     Pain Loc --      Pain Education --      Exclude from Growth Chart --     Most recent vital signs: Vitals:   01/23/24 1704  BP: (!) 172/109  Pulse: 98  Resp: 20  Temp: 98.2 F (36.8 C)  SpO2: 99%     General: Awake, no distress.  CV:  Good peripheral perfusion.  No murmur Resp:  Normal effort.  Wheezing noted bilaterally Abd:  No distention.  Soft and nontender Other:  No swelling legs.  No calf tenderness.  She does have a knee brace on  her right knee.  She reports having a torn PCL that she is getting physical therapy for but she has not had any surgery and is been up and ambulatory with the brace. Patient has a hoarse voice noted on examination.  Uvula midline.  No exudates on the tonsils No sensation changes.  Equal pulses throughout   ED Results / Procedures / Treatments   Labs (all labs ordered are listed, but only abnormal results are displayed) Labs Reviewed  BASIC METABOLIC PANEL WITH GFR - Abnormal; Notable for the following components:      Result Value   CO2 20 (*)    Glucose, Bld 117 (*)    BUN <5 (*)    Calcium 8.5 (*)    All other components within normal limits  CBC - Abnormal; Notable for the following components:   Hemoglobin 15.5 (*)    MCH 34.3 (*)    All other components within normal limits  GROUP A STREP BY PCR  RESP PANEL BY RT-PCR (RSV, FLU A&B, COVID)  RVPGX2  POC URINE PREG, ED  TROPONIN I (HIGH SENSITIVITY)  TROPONIN I (HIGH SENSITIVITY)     EKG  My interpretation of EKG:  Normal sinus rhythm 98 without any ST elevation or T wave inversions, normal intervals  RADIOLOGY I have reviewed the xray personally and interpreted no evidence of pneumonia   PROCEDURES:  Critical Care performed: No  Procedures   MEDICATIONS ORDERED IN ED: Medications - No data to display   IMPRESSION / MDM / ASSESSMENT AND PLAN / ED COURSE  I reviewed the triage vital signs and the nursing notes.   Patient's presentation is most consistent with acute presentation with potential threat to life or bodily function.    INITIAL IMPRESSION / ASSESSMENT AND PLAN / ED COURSE   Most Likely DDx:  -Consider most like brochitis given wheezing- given the smoking history possible new onset COPD. Also consider less likely ACS vs MSK Vs Tonna Frederic- will get EKG/troponin to evaluate for ACS  DDx that was also considered d/t potential to cause harm, but was found less likely based on history and physical (as  detailed above): -PNA (no fevers, cough but CXR to evaluate) -PNX (reassured with equal b/l breath sounds, CXR to evaluate) -Symptomatic anemia (will get H&H) -Pulmonary embolism as no sob at rest, not pleuritic in nature, no hypoxia- discussed further workup for this with patient but given low risk PERC negative (although does have IUD) and given symptoms seem more consistent with viral illness with hoarse voice, cough declined further workup.  -Aortic Dissection as no tearing pain and no radiation to the mid back, pulses equal, no murmur -Pericarditis no EKG changes or hx to suggest dx -Tamponade (no notable SOB, tachycardic, hypotensive) -Esophageal rupture (no h/o diffuse vomitting/no crepitus)    Cardiac marker was negative.  Symptoms have been ongoing for greater than 3 hours and no need for additional troponin.  Also chest pain seems to be more with coughing so seems to be related to the bronchitis.  BMP was reassuring.  CBC shows stable hemoglobin slightly elevated.  Strep test was negative.  COVID, flu were negative noted mention some vibration in her leg but she reports that is just from her wrapping on tight for her knee but she denies any prolonged immobilization related to it.  She still up and walking daily is just to help stabilize the knee.  She denies any calf tenderness or symptoms of DVT   At this time given re-assuring workup I have considered CT imaging to rule out PE/Dissection but given history and physical exam these seem less likely and potential harm from CT would outweigh the probability of finding PE or Dissection.   Discussed with patient smoking cessation.  We discussed prednisone, doxycycline, albuterol inhaler but suspect this is more likely viral in nature.  She declined pregnancy test and understands there is a risk for doxycycline if she is pregnant.  However she reports not being sexually active in over a month and has IUD and does not want to have the pregnancy  test done.  She states that she is ready for discharge home.  Patient's oxygen levels are reassuring.  Her blood pressure is elevated but could be secondary to illness she will follow-up for recheck with her primary care doctor after getting over the illness.   Note:  This document was prepared using Dragon voice recognition software and may include unintentional dictation errors.  The patient is on the cardiac monitor to evaluate for evidence of arrhythmia and/or significant heart rate changes.      FINAL CLINICAL IMPRESSION(S) / ED DIAGNOSES   Final diagnoses:  Bronchitis     Rx / DC Orders   ED Discharge Orders          Ordered    predniSONE (DELTASONE) 20 MG tablet  Daily with breakfast        01/23/24 2016    albuterol (VENTOLIN HFA) 108 (90 Base) MCG/ACT inhaler  Every 6 hours PRN        01/23/24 2016    doxycycline (VIBRA-TABS) 100 MG tablet  2 times daily        01/23/24 2016             Note:  This document was prepared using Dragon voice recognition software and may include unintentional dictation errors.   Lubertha Rush, MD 01/23/24 2018    Lubertha Rush, MD 01/23/24 2019

## 2024-01-23 NOTE — Discharge Instructions (Addendum)
 Follow-up with your primary care doctor for recheck of your blood pressure return to the ER if develop worsening shortness of breath or any other concerns.   Cut down on smoking use.

## 2024-01-26 ENCOUNTER — Ambulatory Visit: Admitting: Cardiology

## 2024-02-01 ENCOUNTER — Ambulatory Visit: Admitting: Nurse Practitioner

## 2024-03-13 ENCOUNTER — Telehealth: Payer: Self-pay | Admitting: Cardiology

## 2024-03-13 NOTE — Telephone Encounter (Signed)
 Patient calling to see if she should still wear the heart monitor or not. Please advise

## 2024-03-13 NOTE — Telephone Encounter (Signed)
 Called patient, advised to wear monitor as recommended.  Patient verbalized understanding.

## 2024-04-29 ENCOUNTER — Other Ambulatory Visit: Payer: Self-pay

## 2024-04-29 ENCOUNTER — Other Ambulatory Visit: Payer: Self-pay | Admitting: Cardiology

## 2024-04-30 ENCOUNTER — Other Ambulatory Visit: Payer: Self-pay

## 2024-05-01 ENCOUNTER — Other Ambulatory Visit: Payer: Self-pay

## 2024-05-03 ENCOUNTER — Other Ambulatory Visit: Payer: Self-pay | Admitting: Cardiology

## 2024-05-03 ENCOUNTER — Other Ambulatory Visit: Payer: Self-pay

## 2024-05-03 MED ORDER — METOPROLOL SUCCINATE ER 25 MG PO TB24
25.0000 mg | ORAL_TABLET | Freq: Every day | ORAL | 3 refills | Status: DC
  Filled 2024-05-03 – 2024-05-27 (×2): qty 90, 90d supply, fill #0

## 2024-05-17 ENCOUNTER — Other Ambulatory Visit: Payer: Self-pay

## 2024-05-23 ENCOUNTER — Ambulatory Visit: Payer: Self-pay | Admitting: Cardiology

## 2024-05-23 DIAGNOSIS — I471 Supraventricular tachycardia, unspecified: Secondary | ICD-10-CM | POA: Diagnosis not present

## 2024-05-27 ENCOUNTER — Other Ambulatory Visit (HOSPITAL_COMMUNITY): Payer: Self-pay

## 2024-05-27 ENCOUNTER — Other Ambulatory Visit: Payer: Self-pay

## 2024-05-28 ENCOUNTER — Encounter: Payer: Self-pay | Admitting: Pharmacist

## 2024-05-28 ENCOUNTER — Other Ambulatory Visit: Payer: Self-pay

## 2024-05-28 NOTE — Progress Notes (Unsigned)
 Electrophysiology Clinic Note    Date:  05/29/2024  Patient ID:  Bethany Castaneda, Bethany Castaneda 1981-03-25, MRN [id number] PCP:  Kandis Stefano Iles, MD  Cardiologist:  Redell Cave, MD   Electrophysiologist:  OLE ONEIDA HOLTS, MD  Electrophysiology APP:  Greycen Felter, NP    Discussed the use of AI scribe software for clinical note transcription with the patient, who gave verbal consent to proceed.   Patient Profile    Chief Complaint: palpitation follow-up  History of Present Illness: Bethany Castaneda is a 43 y.o. female with PMH notable for SVT, tobacco use, anxiety, gout; seen today for OLE ONEIDA HOLTS, MD for acute visit due to palpitations.    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 HOLTS.  I saw her for 1 month post-ablation appt where patient felt worse than pre-ablation - daily palpitations with shoulder tightness, significantly more anxiety. Updated zio showed 2 SVT episodes not associated with symptom activation.  On follow-up today, she has overall had improvement in her palpitations until this past weekend where she had a very intense episode accompanied with L arm discomfort and intense anxiety. She estimates the episode lasted 60-90 minutes before self-resolving. She did not have any palpitation episodes while she was wearing the zio monitor.   Her PCP suggested an anti-anxiety medication, but patient has not started it yet. She is having difficulties obtaining her metop, is currently out of it.  She has reduced tobacco use, is interested in quitting.     Arrhythmia/Device History No specialty comments available.    ROS:  Please see the history of present illness. All other systems are reviewed and otherwise negative.    Physical Exam    VS:  BP 136/80 (BP Location: Left Arm, Patient Position: Sitting, Cuff Size: Normal)   Pulse (!) 104   Ht 5' 6 (1.676 m)   Wt 153 lb (69.4 kg)   SpO2 98%   BMI 24.69 kg/m  BMI: Body mass  index is 24.69 kg/m.      Wt Readings from Last 3 Encounters:  05/29/24 153 lb (69.4 kg)  01/23/24 155 lb (70.3 kg)  01/12/24 155 lb 6.4 oz (70.5 kg)     GEN- The patient is well appearing, alert and oriented x 3 today.   Lungs- Clear to ausculation bilaterally, normal work of breathing.  Heart- Regular, tachycardic rate and rhythm, no murmurs, rubs or gallops Extremities- No peripheral edema, warm, dry   Studies Reviewed   Previous EP, cardiology notes.    EKG is ordered. Personal review of EKG from today shows:    EKG Interpretation Date/Time:  Wednesday May 29 2024 13:12:06 EDT Ventricular Rate:  104 PR Interval:  112 QRS Duration:  62 QT Interval:  350 QTC Calculation: 460 R Axis:   35  Text Interpretation: Sinus tachycardia Low voltage QRS Confirmed by Ophelia Sipe [phone]) on 05/29/2024 1:45:23 PM    Long term monitor, 05/23/2024 Patch Wear Time:  7 days and 22 hours (2025-09-04T20:21:25-0400 to 2025-09-12T19:06:55-0400)   Patient had a min HR of 52 bpm, max HR of 160 bpm, and avg HR of 89 bpm. Predominant underlying rhythm was Sinus Rhythm. Slight P wave morphology changes were noted. 2 Supraventricular Tachycardia runs occurred, the run with the fastest interval lasting  4 beats with a max rate of 160 bpm, the longest lasting 15 beats with an avg rate of 126 bpm. Isolated SVEs were rare (<1.0%), SVE Couplets were rare (<1.0%),  and SVE Triplets were rare (<1.0%). Isolated VEs were rare (<1.0%), VE Couplets were rare  (<1.0%), and no VE Triplets were present. Ventricular Trigeminy was present.   Conclusion Average heart rate 89, range 52-160 bpm. 2 episodes of nonsustained SVT, longest lasting 15 beats. No atrial fibrillation or atrial flutter. No sustained arrhythmias.  TTE, 11/13/2023 1. Left ventricular ejection fraction, by estimation, is 50 to 55%. The left ventricle has low normal function. The left ventricle has no regional wall motion abnormalities.  The left ventricular internal cavity size was mildly dilated. Left ventricular diastolic parameters were normal.   2. Right ventricular systolic function is normal. The right ventricular size is normal. Tricuspid regurgitation signal is inadequate for assessing PA pressure.   3. The mitral valve is normal in structure. No evidence of mitral valve regurgitation. No evidence of mitral stenosis.   4. The aortic valve is normal in structure. Aortic valve regurgitation is not visualized. No aortic stenosis is present.   5. The inferior vena cava is normal in size with greater than 50% respiratory variability, suggesting right atrial pressure of 3 mmHg.    Coronary CTA, 09/04/2023 1. Coronary calcium score of 0.  2. Normal coronary origin with left dominance.  3. No evidence of CAD.  4. CAD-RADS 0. Consider non-atherosclerotic causes of chest pain   Assessment and Plan     #) SVT #) palpitations #) anxiety Patient recently ran out of metoprolol , having difficulties obtaining from pharmacy d/t limited hours Encouraged her to visit [facility] pharmacy while she is here to follow-up on script Then transfer medication to local [facility] with extended hours STRONGLY encouraged her to start anxiety medication as recommended by PCP Consider obtaining [employer] to identify rhythm during episodes Monitor palpitation burden after resumption of toprol , consider adding PRN if continues to have episodes.      Current medicines are reviewed at length with the patient today.   The patient has concerns regarding her medicines.  The following changes were made today:  none  Labs/ tests ordered today include:  Orders Placed This Encounter  Procedures   EKG 12-Lead     Disposition: Follow up with EP APP or EP APP 6 weeks . If doing well, ok to extend follow-up to 3-6 months   Signed, Amenda Duclos, NP  05/29/24  1:57 PM  Electrophysiology [facility]

## 2024-05-29 ENCOUNTER — Encounter: Payer: Self-pay | Admitting: Cardiology

## 2024-05-29 ENCOUNTER — Ambulatory Visit: Attending: Cardiology | Admitting: Cardiology

## 2024-05-29 VITALS — BP 136/80 | HR 104 | Ht 66.0 in | Wt 153.0 lb

## 2024-05-29 DIAGNOSIS — I471 Supraventricular tachycardia, unspecified: Secondary | ICD-10-CM | POA: Insufficient documentation

## 2024-05-29 DIAGNOSIS — R002 Palpitations: Secondary | ICD-10-CM | POA: Insufficient documentation

## 2024-05-29 DIAGNOSIS — F419 Anxiety disorder, unspecified: Secondary | ICD-10-CM | POA: Insufficient documentation

## 2024-05-29 NOTE — Patient Instructions (Signed)
 Medication Instructions:  Your physician recommends that you continue on your current medications as directed. Please refer to the Current Medication list given to you today.    *If you need a refill on your cardiac medications before your next appointment, please call your pharmacy*  Lab Work: No labs ordered today    Testing/Procedures: No test ordered today   Follow-Up: At [facility], you and your health needs are our priority.  As part of our continuing mission to provide you with exceptional heart care, our providers are all part of one team.  This team includes your primary Cardiologist (physician) and Advanced Practice Providers or APPs (Physician Assistants and Nurse Practitioners) who all work together to provide you with the care you need, when you need it.  Your next appointment:   07/15/24 @ 2:30 pm   Provider:   Suzann Riddle, NP

## 2024-05-31 ENCOUNTER — Other Ambulatory Visit: Payer: Self-pay

## 2024-06-04 ENCOUNTER — Emergency Department

## 2024-06-04 ENCOUNTER — Other Ambulatory Visit: Payer: Self-pay

## 2024-06-04 ENCOUNTER — Emergency Department
Admission: EM | Admit: 2024-06-04 | Discharge: 2024-06-05 | Disposition: A | Discharge status: Home / Self Care | Attending: Emergency Medicine | Admitting: Emergency Medicine

## 2024-06-04 DIAGNOSIS — E876 Hypokalemia: Secondary | ICD-10-CM | POA: Insufficient documentation

## 2024-06-04 DIAGNOSIS — M546 Pain in thoracic spine: Secondary | ICD-10-CM | POA: Insufficient documentation

## 2024-06-04 DIAGNOSIS — E871 Hypo-osmolality and hyponatremia: Secondary | ICD-10-CM | POA: Diagnosis not present

## 2024-06-04 DIAGNOSIS — R0602 Shortness of breath: Secondary | ICD-10-CM | POA: Diagnosis not present

## 2024-06-04 DIAGNOSIS — R0789 Other chest pain: Secondary | ICD-10-CM | POA: Diagnosis present

## 2024-06-04 LAB — D-DIMER, QUANTITATIVE: D-Dimer, Quant: 0.7 ug{FEU}/mL — ABNORMAL HIGH (ref 0.00–0.50)

## 2024-06-04 LAB — URINALYSIS, ROUTINE W REFLEX MICROSCOPIC
Bacteria, UA: NONE SEEN
Bilirubin Urine: NEGATIVE
Glucose, UA: NEGATIVE mg/dL
Ketones, ur: NEGATIVE mg/dL
Leukocytes,Ua: NEGATIVE
Nitrite: NEGATIVE
Protein, ur: 30 mg/dL — AB
Specific Gravity, Urine: 1.021 (ref 1.005–1.030)
WBC, UA: 0 WBC/hpf (ref 0–5)
pH: 5 (ref 5.0–8.0)

## 2024-06-04 LAB — CBC
HCT: 41.2 % (ref 36.0–46.0)
Hemoglobin: 14.9 g/dL (ref 12.0–15.0)
MCH: 37.3 pg — ABNORMAL HIGH (ref 26.0–34.0)
MCHC: 36.2 g/dL — ABNORMAL HIGH (ref 30.0–36.0)
MCV: 103 fL — ABNORMAL HIGH (ref 80.0–100.0)
Platelets: 288 K/uL (ref 150–400)
RBC: 4 MIL/uL (ref 3.87–5.11)
RDW: 14.7 % (ref 11.5–15.5)
WBC: 9 K/uL (ref 4.0–10.5)
nRBC: 0 % (ref 0.0–0.2)

## 2024-06-04 LAB — TROPONIN I (HIGH SENSITIVITY)
Troponin I (High Sensitivity): 3 ng/L (ref <18)
Troponin I (High Sensitivity): 3 ng/L (ref <18)

## 2024-06-04 LAB — POC URINE PREG, ED: Preg Test, Ur: NEGATIVE

## 2024-06-04 LAB — BASIC METABOLIC PANEL WITH GFR
Anion gap: 13 (ref 5–15)
BUN: <5 mg/dL — ABNORMAL LOW (ref 6–20)
CO2: 21 mmol/L — ABNORMAL LOW (ref 22–32)
Calcium: 8.4 mg/dL — ABNORMAL LOW (ref 8.9–10.3)
Chloride: 100 mmol/L (ref 98–111)
Creatinine, Ser: 0.64 mg/dL (ref 0.44–1.00)
GFR, Estimated: >60 mL/min (ref >60)
Glucose, Bld: 114 mg/dL — ABNORMAL HIGH (ref 70–99)
Potassium: 3.4 mmol/L — ABNORMAL LOW (ref 3.5–5.1)
Sodium: 134 mmol/L — ABNORMAL LOW (ref 135–145)

## 2024-06-04 MED ORDER — IOHEXOL 350 MG/ML SOLN
75.0000 mL | Freq: Once | INTRAVENOUS | Status: AC | PRN
Start: 2024-06-04 — End: 2024-06-04
  Administered 2024-06-04: 75 mL via INTRAVENOUS

## 2024-06-04 MED ORDER — METOPROLOL SUCCINATE ER 25 MG PO TB24: 25.0000 mg | ORAL_TABLET | Freq: Every day | ORAL | 0 refills | Status: AC

## 2024-06-04 NOTE — ED Provider Notes (Signed)
 [facility] Provider Note    Event Date/Time   First MD Initiated Contact with Patient 06/04/24 1904     (approximate)   History   Chest Pain, Shortness of Breath, and Back Pain   HPI  Bethany Castaneda is a 43 y.o. female with a history of SVT status post ablation who presents with shortness of breath, as well as chest and back pain since this morning.  The patient states that after dropping her kids off at school, she started to feel very short of breath, weak, and lightheaded.  She then started having pain in her mid/upper back and some pain into her chest.  She slept for a few hours and seem to feel better, but then the symptoms came back later this afternoon.  She states that now since she has been in the ED the symptoms have almost completely resolved.  She is not having active chest pain at this time.  She is still having some back pain but states she is feeling much better than she was earlier.  She has not had an episode like this before.  The patient denies any leg pain or swelling.  She denies any cough or fever.  She has no nausea or vomiting.  I reviewed the past medical records.  The patient's most recent outpatient encounter was with electrophysiology on 9/24 for follow-up of her SVT.  She was thought to possibly have anxiety.  She has recently been prescribed antianxiety medication.  The patient also reports some difficulty getting a refill of her metoprolol  so she has not been on it recently.   Physical Exam   Triage Vital Signs: ED Triage Vitals  Encounter Vitals Group     BP 06/04/24 1758 (!) 153/112     Girls Systolic BP Percentile --      Girls Diastolic BP Percentile --      Boys Systolic BP Percentile --      Boys Diastolic BP Percentile --      Pulse Rate 06/04/24 1758 (!) 114     Resp 06/04/24 1806 17     Temp 06/04/24 1758 98.1 F (36.7 C)     Temp Source 06/04/24 1758 Oral     SpO2 06/04/24 1758 99 %     Weight 06/04/24 1758 154  lb (69.9 kg)     Height 06/04/24 1758 5' 6 (1.676 m)     Head Circumference --      Peak Flow --      Pain Score 06/04/24 1809 7     Pain Loc --      Pain Education --      Exclude from Growth Chart --     Most recent vital signs: Vitals:   06/04/24 1806 06/04/24 1850  BP:  (!) 147/100  Pulse:  92  Resp: 17 13  Temp:    SpO2:  100%     General: Awake, no distress.  CV:  Good peripheral perfusion.  Normal heart sounds. Resp:  Normal effort.  Lungs CTAB. Abd:  No distention.  Other:  No peripheral edema.   ED Results / Procedures / Treatments   Labs (all labs ordered are listed, but only abnormal results are displayed) Labs Reviewed  BASIC METABOLIC PANEL WITH GFR - Abnormal; Notable for the following components:      Result Value   Sodium 134 (*)    Potassium 3.4 (*)    CO2 21 (*)    Glucose, Bld  114 (*)    BUN <5 (*)    Calcium 8.4 (*)    All other components within normal limits  CBC - Abnormal; Notable for the following components:   MCV 103.0 (*)    MCH 37.3 (*)    MCHC 36.2 (*)    All other components within normal limits  URINALYSIS, ROUTINE W REFLEX MICROSCOPIC - Abnormal; Notable for the following components:   Color, Urine YELLOW (*)    APPearance TURBID (*)    Hgb urine dipstick SMALL (*)    Protein, ur 30 (*)    All other components within normal limits  POC URINE PREG, ED  TROPONIN I (HIGH SENSITIVITY)  TROPONIN I (HIGH SENSITIVITY)     EKG  ED ECG REPORT I, Waylon Cassis, the attending physician, personally viewed and interpreted this ECG.  Date: 06/04/2024 EKG Time: 1803 Rate: 100 Rhythm: normal sinus rhythm QRS Axis: normal Intervals: normal ST/T Wave abnormalities: normal Narrative Interpretation: no evidence of acute ischemia    RADIOLOGY  Chest x-ray: I independently viewed and interpreted the images; there is no focal consolidation or edema  PROCEDURES:  Critical Care performed: No  Procedures   MEDICATIONS  ORDERED IN ED: Medications - No data to display   IMPRESSION / MDM / ASSESSMENT AND PLAN / ED COURSE  I reviewed the triage vital signs and the nursing notes.  43 year old female with PMH as noted above presents with shortness of breath, chest and upper back pain lasting several hours today although not associated with any palpitations.  The symptoms have mostly resolved at this time and she is feeling much better.  On exam her blood pressure is borderline elevated (she states it was as high as 150/110 at home).  Physical exam is otherwise unremarkable.  Her EKG is nonischemic.  Chest x-ray is clear.  Differential diagnosis includes, but is not limited to, SVT episode, ACS, musculoskeletal pain, GERD, less likely PE.  I have a low suspicion for aortic dissection or other vascular etiology given the patient's age and lack of risk factors as well as the mostly resolved symptoms.  PE is also less likely given the lack of hypoxia or tachycardia and the improvement in her symptoms.  Chest x-ray shows no acute findings.  Initial troponin is negative.  We will obtain a repeat troponin and a D-dimer and reassess.  Patient's presentation is most consistent with acute presentation with potential threat to life or bodily function.  The patient is on the cardiac monitor to evaluate for evidence of arrhythmia and/or significant heart rate changes.   FINAL CLINICAL IMPRESSION(S) / ED DIAGNOSES   Final diagnoses:  None     Rx / DC Orders   ED Discharge Orders     None        Note:  This document was prepared using Dragon voice recognition software and may include unintentional dictation errors.

## 2024-06-04 NOTE — ED Triage Notes (Addendum)
 Pt arrives via POV. PT reports shortly after waking up this morning, she began experiencing pain in her mid upper back, nausea, dizziness, intermittent stabbing chest pain, sob, and fatigue. PT arrives AxOx4.

## 2024-06-04 NOTE — Discharge Instructions (Addendum)
 Return to the ER for new, worsening, or persistent severe chest or back pain, difficulty breathing, palpitations, dizziness, or any other new or worsening symptoms that concern you.  Follow-up with your cardiologist.

## 2024-06-09 ENCOUNTER — Emergency Department

## 2024-06-09 ENCOUNTER — Other Ambulatory Visit: Payer: Self-pay

## 2024-06-09 ENCOUNTER — Emergency Department
Admission: EM | Admit: 2024-06-09 | Discharge: 2024-06-09 | Disposition: A | Discharge status: Home / Self Care | Attending: Emergency Medicine | Admitting: Emergency Medicine

## 2024-06-09 DIAGNOSIS — R112 Nausea with vomiting, unspecified: Secondary | ICD-10-CM | POA: Insufficient documentation

## 2024-06-09 DIAGNOSIS — R197 Diarrhea, unspecified: Secondary | ICD-10-CM | POA: Insufficient documentation

## 2024-06-09 DIAGNOSIS — R0789 Other chest pain: Secondary | ICD-10-CM | POA: Diagnosis not present

## 2024-06-09 DIAGNOSIS — E860 Dehydration: Secondary | ICD-10-CM | POA: Insufficient documentation

## 2024-06-09 DIAGNOSIS — R519 Headache, unspecified: Secondary | ICD-10-CM | POA: Diagnosis present

## 2024-06-09 DIAGNOSIS — Z720 Tobacco use: Secondary | ICD-10-CM | POA: Diagnosis not present

## 2024-06-09 LAB — COMPREHENSIVE METABOLIC PANEL WITH GFR
ALT: 9 U/L (ref 0–44)
AST: 21 U/L (ref 15–41)
Albumin: 3.3 g/dL — ABNORMAL LOW (ref 3.5–5.0)
Alkaline Phosphatase: 95 U/L (ref 38–126)
Anion gap: 17 — ABNORMAL HIGH (ref 5–15)
BUN: <5 mg/dL — ABNORMAL LOW (ref 6–20)
CO2: 20 mmol/L — ABNORMAL LOW (ref 22–32)
Calcium: 8.1 mg/dL — ABNORMAL LOW (ref 8.9–10.3)
Chloride: 102 mmol/L (ref 98–111)
Creatinine, Ser: 0.49 mg/dL (ref 0.44–1.00)
GFR, Estimated: >60 mL/min (ref >60)
Glucose, Bld: 143 mg/dL — ABNORMAL HIGH (ref 70–99)
Potassium: 3.1 mmol/L — ABNORMAL LOW (ref 3.5–5.1)
Sodium: 139 mmol/L (ref 135–145)
Total Bilirubin: 0.8 mg/dL (ref 0.0–1.2)
Total Protein: 6.2 g/dL — ABNORMAL LOW (ref 6.5–8.1)

## 2024-06-09 LAB — CBC WITH DIFFERENTIAL/PLATELET
Abs Immature Granulocytes: 0.02 K/uL (ref 0.00–0.07)
Basophils Absolute: 0 K/uL (ref 0.0–0.1)
Basophils Relative: 1 %
Eosinophils Absolute: 0.1 K/uL (ref 0.0–0.5)
Eosinophils Relative: 2 %
HCT: 40.6 % (ref 36.0–46.0)
Hemoglobin: 14.2 g/dL (ref 12.0–15.0)
Immature Granulocytes: 0 %
Lymphocytes Relative: 38 %
Lymphs Abs: 2.2 K/uL (ref 0.7–4.0)
MCH: 36.2 pg — ABNORMAL HIGH (ref 26.0–34.0)
MCHC: 35 g/dL (ref 30.0–36.0)
MCV: 103.6 fL — ABNORMAL HIGH (ref 80.0–100.0)
Monocytes Absolute: 0.5 K/uL (ref 0.1–1.0)
Monocytes Relative: 9 %
Neutro Abs: 2.8 K/uL (ref 1.7–7.7)
Neutrophils Relative %: 50 %
Platelets: 298 K/uL (ref 150–400)
RBC: 3.92 MIL/uL (ref 3.87–5.11)
RDW: 15 % (ref 11.5–15.5)
WBC: 5.7 K/uL (ref 4.0–10.5)
nRBC: 0 % (ref 0.0–0.2)

## 2024-06-09 LAB — T4, FREE: Free T4: 0.62 ng/dL (ref 0.61–1.12)

## 2024-06-09 LAB — TROPONIN I (HIGH SENSITIVITY): Troponin I (High Sensitivity): 3 ng/L (ref <18)

## 2024-06-09 LAB — TSH: TSH: 5.687 u[IU]/mL — ABNORMAL HIGH (ref 0.350–4.500)

## 2024-06-09 MED ORDER — ACETAMINOPHEN 325 MG PO TABS
650.0000 mg | ORAL_TABLET | Freq: Once | ORAL | Status: AC
  Administered 2024-06-09: 650 mg via ORAL
  Filled 2024-06-09: qty 2

## 2024-06-09 MED ORDER — ONDANSETRON 4 MG PO TBDP
4.0000 mg | ORAL_TABLET | Freq: Once | ORAL | Status: AC
Start: 2024-06-09 — End: 2024-06-09
  Administered 2024-06-09: 4 mg via ORAL
  Filled 2024-06-09: qty 1

## 2024-06-09 MED ORDER — POTASSIUM CHLORIDE CRYS ER 20 MEQ PO TBCR
40.0000 meq | EXTENDED_RELEASE_TABLET | Freq: Once | ORAL | Status: AC
  Administered 2024-06-09: 40 meq via ORAL
  Filled 2024-06-09: qty 2

## 2024-06-09 MED ORDER — SODIUM CHLORIDE 0.9 % IV BOLUS
1000.0000 mL | Freq: Once | INTRAVENOUS | Status: AC
  Administered 2024-06-09: 1000 mL via INTRAVENOUS

## 2024-06-09 MED ORDER — ONDANSETRON 4 MG PO TBDP: 4.0000 mg | ORAL_TABLET | Freq: Three times a day (TID) | ORAL | 0 refills | Status: AC | PRN

## 2024-06-09 NOTE — Discharge Instructions (Addendum)
 Your exam, EKG, chest x-ray, CT scan, and labs have all been reviewed.  They show signs of some dehydration and mildly decreased potassium.  You have been treated with a fluid bolus as well as potassium supplementation.  Your symptoms are improved at this time.  You should continue to hydrate to prevent dehydration.  Take prescription nausea medicine as needed.  Follow-up with your primary provider or return to the ED if needed.

## 2024-06-09 NOTE — ED Provider Notes (Signed)
 [facility] Emergency Department Provider Note     Event Date/Time   First MD Initiated Contact with Patient 06/09/24 1317     (approximate)   History   Emesis   HPI  Bethany Castaneda is a 43 y.o. female with a history of SVT (status post ablation 4/25), anxiety, thyroid disease, daily tobacco use, and migraine presents to the ED endorsing some intermittent dizziness as well as chest tightness and headache.  She had several episodes of vomiting and diarrhea this morning when she awoke at 4:30 AM feeling dizziness.  She describes feeling as if she would pass out while sleeping.  She denies any frank syncope, shortness of breath, diaphoresis, facial droop, or paralysis.  She denies any tinnitus, vertigo, or hearing loss.  She would describe her vision fades in and out.  Patient denies any recent alcohol intake.  No reports of any recent fevers, chills, or sweats.  Patient was evaluated in the ED for same complaints last week with a reassuring workup.  Physical Exam   Triage Vital Signs: ED Triage Vitals  Encounter Vitals Group     BP 06/09/24 1258 (!) 130/112     Girls Systolic BP Percentile --      Girls Diastolic BP Percentile --      Boys Systolic BP Percentile --      Boys Diastolic BP Percentile --      Pulse Rate 06/09/24 1258 95     Resp 06/09/24 1258 17     Temp 06/09/24 1258 98.1 F (36.7 C)     Temp Source 06/09/24 1258 Oral     SpO2 06/09/24 1258 98 %     Weight 06/09/24 1300 154 lb 1.6 oz (69.9 kg)     Height 06/09/24 1300 5' 6 (1.676 m)     Head Circumference --      Peak Flow --      Pain Score 06/09/24 1259 8     Pain Loc --      Pain Education --      Exclude from Growth Chart --     Most recent vital signs: Vitals:   06/09/24 1258 06/09/24 1521  BP: (!) 130/112 (!) 135/90  Pulse: 95 80  Resp: 17 18  Temp: 98.1 F (36.7 C) 98.3 F (36.8 C)  SpO2: 98% 100%    General Awake, no distress. NAD HEENT NCAT. PERRL. EOMI.  No rhinorrhea. Mucous membranes are moist.  CV:  Good peripheral perfusion. RRR RESP:  Normal effort. CTA ABD:  No distention.  Soft and nontender.  No rebound, guarding, or rigidity noted.  No organomegaly appreciated MSK:  AROM of all extremities   ED Results / Procedures / Treatments   Labs (all labs ordered are listed, but only abnormal results are displayed) Labs Reviewed  CBC WITH DIFFERENTIAL/PLATELET - Abnormal; Notable for the following components:      Result Value   MCV 103.6 (*)    MCH 36.2 (*)    All other components within normal limits  COMPREHENSIVE METABOLIC PANEL WITH GFR - Abnormal; Notable for the following components:   Potassium 3.1 (*)    CO2 20 (*)    Glucose, Bld 143 (*)    BUN <5 (*)    Calcium 8.1 (*)    Total Protein 6.2 (*)    Albumin 3.3 (*)    Anion gap 17 (*)    All other components within normal limits  TSH - Abnormal; Notable  for the following components:   TSH 5.687 (*)    All other components within normal limits  T4, FREE  URINALYSIS, ROUTINE W REFLEX MICROSCOPIC  TROPONIN I (HIGH SENSITIVITY)    EKG  Vent. rate 92 BPM PR interval 124 ms QRS duration 70 ms QT/QTcB 376/464 ms P-R-T axes 81 56 56 Normal sinus rhythm Normal ECG When compared with ECG of 04-Jun-2024 18:03, Premature ventricular complexes are no longer Present No STEMI  RADIOLOGY  I personally viewed and evaluated these images as part of my medical decision making, as well as reviewing the written report by the radiologist.  ED Provider Interpretation: No acute intrathoracic process; no acute intracranial process  DG Chest 2 View Result Date: 06/09/2024 CLINICAL DATA:  Chest pain EXAM: CHEST - 2 VIEW COMPARISON:  06/04/2024 FINDINGS: The lungs are clear without focal pneumonia, edema, pneumothorax or pleural effusion. The cardiopericardial silhouette is within normal limits for size. No acute bony abnormality. IMPRESSION: No active cardiopulmonary disease.  Electronically Signed   By: Camellia Candle M.D.   On: 06/09/2024 13:40   CT Head Wo Contrast Result Date: 06/09/2024 CLINICAL DATA:  Vertigo, central EXAM: CT HEAD WITHOUT CONTRAST TECHNIQUE: Contiguous axial images were obtained from the base of the skull through the vertex without intravenous contrast. RADIATION DOSE REDUCTION: This exam was performed according to the departmental dose-optimization program which includes automated exposure control, adjustment of the mA and/or kV according to patient size and/or use of iterative reconstruction technique. COMPARISON:  CT head 09/12/2009 FINDINGS: Brain: No evidence of large-territorial acute infarction. No parenchymal hemorrhage. No mass lesion. No extra-axial collection. No mass effect or midline shift. No hydrocephalus. Basilar cisterns are patent. Vascular: No hyperdense vessel. Skull: No acute fracture or focal lesion. Sinuses/Orbits: Paranasal sinuses and mastoid air cells are clear. The orbits are unremarkable. Other: None. IMPRESSION: No acute intracranial abnormality. Electronically Signed   By: Morgane  Naveau M.D.   On: 06/09/2024 13:31     PROCEDURES:  Critical Care performed: No  Procedures   MEDICATIONS ORDERED IN ED: Medications  ondansetron  (ZOFRAN -ODT) disintegrating tablet 4 mg (4 mg Oral Given 06/09/24 1358)  acetaminophen  (TYLENOL ) tablet 650 mg (650 mg Oral Given 06/09/24 1358)  sodium chloride  0.9 % bolus 1,000 mL (0 mLs Intravenous Stopped 06/09/24 1518)  potassium chloride  SA (KLOR-CON  M) CR tablet 40 mEq (40 mEq Oral Given 06/09/24 1359)     IMPRESSION / MDM / ASSESSMENT AND PLAN / ED COURSE  I reviewed the triage vital signs and the nursing notes.                              Differential diagnosis includes, but is not limited to,  ACS, aortic dissection, pulmonary embolism, cardiac tamponade, pneumothorax, pneumonia, pericarditis, myocarditis, GI-related causes including esophagitis/gastritis, and musculoskeletal  chest wall pain.    Patient's presentation is most consistent with acute complicated illness / injury requiring diagnostic workup.  Patient's diagnosis is consistent with acute dehydration secondary to nausea and vomiting, with associated headache.  Patient presents endorsing headache, nausea, vomiting, and diarrhea with onset in the early hours this morning.  She denies any sick contacts or bad food exposure.  She reports some fogginess as well as some generalized weakness.  Patient presented to the ED in no acute distress.  No intractable nausea or vomiting noted.  Vital signs are stable without evidence of tachycardia or fever.  Labs did indicate a mild anion gap and  mild hypokalemia, likely due to her reported fluid losses.  CBC without evidence of leukocytosis or critical anemia.  Troponin was reassuring and TSH mildly elevated at 5.6.  X-rays of the chest and head CT interpreted by me, showed no acute processes respectively.  Patient received a fluid bolus as well as potassium supplementation in the ED.  At the time of my evaluation, she endorses improvement of her headache and nausea.  She stable at this time for outpatient management.  Patient will be discharged home with prescriptions for Zofran . Patient is to follow up with her PCP as discussed, as needed or otherwise directed. Patient is given ED precautions to return to the ED for any worsening or new symptoms.  FINAL CLINICAL IMPRESSION(S) / ED DIAGNOSES   Final diagnoses:  Dehydration  Nausea vomiting and diarrhea  Nonintractable headache, unspecified chronicity pattern, unspecified headache type     Rx / DC Orders   ED Discharge Orders          Ordered    ondansetron  (ZOFRAN -ODT) 4 MG disintegrating tablet  Every 8 hours PRN        06/09/24 1504             Note:  This document was prepared using Dragon voice recognition software and may include unintentional dictation errors.    Loyd Candida LULLA Aldona, PA-C 06/09/24  1539    Arlander Charleston, MD 06/09/24 [phone]

## 2024-06-09 NOTE — ED Triage Notes (Addendum)
 Pt reports waking up feeling dizzy at 0430am with vomiting and chest tightness. Pt seen here last week for the same. Pt says she has a headache and vision fades in and out.

## 2024-07-15 ENCOUNTER — Ambulatory Visit: Admitting: Cardiology

## 2024-07-15 NOTE — Progress Notes (Deleted)
 Electrophysiology Clinic Note    Date:  07/15/2024  Patient ID:  Bethany Castaneda, Bethany Castaneda [birth year], MRN [id number] PCP:  Kandis Stefano Iles, MD  Cardiologist:  Redell Cave, MD   Electrophysiologist:  OLE ONEIDA HOLTS, MD  Electrophysiology APP:  Jaion Lagrange, NP    Discussed the use of AI scribe software for clinical note transcription with the patient, who gave verbal consent to proceed.   Patient Profile    Chief Complaint: palpitation follow-up  History of Present Illness: Bethany Castaneda is a 43 y.o. female with PMH notable for SVT, tobacco use, anxiety, gout; seen today for OLE ONEIDA HOLTS, MD for acute visit due to palpitations.    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 Holts.  I saw her for 1 month post-ablation appt where patient felt worse than pre-ablation - daily palpitations with shoulder tightness, significantly more anxiety. Updated zio showed 2 SVT episodes not associated with symptom activation.  I again saw her 05/2024 where she had another intense palpitation episode. She had run out of metop and working to get it refilled. PCP had recommended anti-anxiety medication that patient was hesitant to start. I strongly recommended she start anti-anxiety meds.    On follow-up today,  ***    Arrhythmia/Device History No specialty comments available.    ROS:  Please see the history of present illness. All other systems are reviewed and otherwise negative.    Physical Exam    VS:  There were no vitals taken for this visit. BMI: There is no height or weight on file to calculate BMI.      Wt Readings from Last 3 Encounters:  06/09/24 154 lb 1.6 oz (69.9 kg)  06/04/24 154 lb (69.9 kg)  05/29/24 153 lb (69.4 kg)     GEN- The patient is well appearing, alert and oriented x 3 today.   Lungs- Clear to ausculation bilaterally, normal work of breathing.  Heart- Regular, tachycardic rate and rhythm, no murmurs, rubs or  gallops Extremities- No peripheral edema, warm, dry   Studies Reviewed   Previous EP, cardiology notes.    EKG is ordered. Personal review of EKG from today shows:         Long term monitor, 05/23/2024 Patch Wear Time:  7 days and 22 hours (2025-09-04T20:21:25-0400 to 2025-09-12T19:06:55-0400)   Patient had a min HR of 52 bpm, max HR of 160 bpm, and avg HR of 89 bpm. Predominant underlying rhythm was Sinus Rhythm. Slight P wave morphology changes were noted. 2 Supraventricular Tachycardia runs occurred, the run with the fastest interval lasting  4 beats with a max rate of 160 bpm, the longest lasting 15 beats with an avg rate of 126 bpm. Isolated SVEs were rare (<1.0%), SVE Couplets were rare (<1.0%), and SVE Triplets were rare (<1.0%). Isolated VEs were rare (<1.0%), VE Couplets were rare  (<1.0%), and no VE Triplets were present. Ventricular Trigeminy was present.   Conclusion Average heart rate 89, range 52-160 bpm. 2 episodes of nonsustained SVT, longest lasting 15 beats. No atrial fibrillation or atrial flutter. No sustained arrhythmias.  TTE, 11/13/2023 1. Left ventricular ejection fraction, by estimation, is 50 to 55%. The left ventricle has low normal function. The left ventricle has no regional wall motion abnormalities. The left ventricular internal cavity size was mildly dilated. Left ventricular diastolic parameters were normal.   2. Right ventricular systolic function is normal. The right ventricular size is normal. Tricuspid regurgitation signal  is inadequate for assessing PA pressure.   3. The mitral valve is normal in structure. No evidence of mitral valve regurgitation. No evidence of mitral stenosis.   4. The aortic valve is normal in structure. Aortic valve regurgitation is not visualized. No aortic stenosis is present.   5. The inferior vena cava is normal in size with greater than 50% respiratory variability, suggesting right atrial pressure of 3 mmHg.    Coronary  CTA, 09/04/2023 1. Coronary calcium score of 0.  2. Normal coronary origin with left dominance.  3. No evidence of CAD.  4. CAD-RADS 0. Consider non-atherosclerotic causes of chest pain   Assessment and Plan     #) SVT #) palpitations #) anxiety Patient recently ran out of metoprolol , having difficulties obtaining from pharmacy d/t limited hours Encouraged her to visit [facility] pharmacy while she is here to follow-up on script Then transfer medication to local [facility] with extended hours STRONGLY encouraged her to start anxiety medication as recommended by PCP Consider obtaining KardiaMobile to identify rhythm during episodes Monitor palpitation burden after resumption of toprol , consider adding PRN if continues to have episodes.      Current medicines are reviewed at length with the patient today.   The patient has concerns regarding her medicines.  The following changes were made today:  none  Labs/ tests ordered today include:  No orders of the defined types were placed in this encounter.    Disposition: Follow up with EP APP or EP APP 6 weeks . If doing well, ok to extend follow-up to 3-6 months   Signed, Chibueze Beasley, NP  07/15/24  1:13 PM  Electrophysiology [facility]

## 2024-08-19 NOTE — Progress Notes (Deleted)
 Electrophysiology Clinic Note    Date:  08/19/2024  Patient ID:  Bethany Castaneda, Bethany Castaneda 08/17/1981, MRN [id number] PCP:  Kandis Stefano Iles, MD  Cardiologist:  Redell Cave, MD  Electrophysiologist:  Fonda Kitty, MD  Electrophysiology APP:  Daritza Brees, NP    ***refresh  Discussed the use of AI scribe software for clinical note transcription with the patient, who gave verbal consent to proceed.   Patient Profile    Chief Complaint: palpitation follow-up  History of Present Illness: Bethany Castaneda is a 43 y.o. female with PMH notable for SVT, tobacco use, anxiety, gout; seen today for Fonda Kitty, MD (previously Dr. Cindie) for routine electrophysiology followup.   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 Cindie.  I saw her for 1 month post-ablation appt where patient felt worse than pre-ablation - daily palpitations with shoulder tightness, significantly more anxiety. Updated zio showed 2 SVT episodes not associated with symptom activation.  I again saw her 05/2024 where she had another intense palpitation episode. She had run out of metop and working to get it refilled. PCP had recommended anti-anxiety medication that patient was hesitant to start. I strongly recommended she start anti-anxiety meds.   On follow-up today,  ***     Arrhythmia/Device History No specialty comments available.    ROS:  Please see the history of present illness. All other systems are reviewed and otherwise negative.    Physical Exam    VS:  There were no vitals taken for this visit. BMI: There is no height or weight on file to calculate BMI.           Wt Readings from Last 3 Encounters:  06/09/24 154 lb 1.6 oz (69.9 kg)  06/04/24 154 lb (69.9 kg)  05/29/24 153 lb (69.4 kg)     GEN- The patient is well appearing, alert and oriented x 3 today.   Lungs- Clear to ausculation bilaterally, normal work of breathing.  Heart- {Blank  single:19197::Regular,Irregularly irregular} rate and rhythm, no murmurs, rubs or gallops Extremities- {EDEMA LEVEL:28147::No} peripheral edema, warm, dry   Studies Reviewed   Previous EP, cardiology notes.    EKG {ACTION; IS/IS WNU:[id number]} ordered. Personal review of EKG from {Blank single:19197::today,***} shows:  ***        Long term monitor, 05/23/2024 Patch Wear Time:  7 days and 22 hours (2025-09-04T20:21:25-0400 to 2025-09-12T19:06:55-0400)   Patient had a min HR of 52 bpm, max HR of 160 bpm, and avg HR of 89 bpm. Predominant underlying rhythm was Sinus Rhythm. Slight P wave morphology changes were noted. 2 Supraventricular Tachycardia runs occurred, the run with the fastest interval lasting  4 beats with a max rate of 160 bpm, the longest lasting 15 beats with an avg rate of 126 bpm. Isolated SVEs were rare (<1.0%), SVE Couplets were rare (<1.0%), and SVE Triplets were rare (<1.0%). Isolated VEs were rare (<1.0%), VE Couplets were rare  (<1.0%), and no VE Triplets were present. Ventricular Trigeminy was present.   Conclusion Average heart rate 89, range 52-160 bpm. 2 episodes of nonsustained SVT, longest lasting 15 beats. No atrial fibrillation or atrial flutter. No sustained arrhythmias.  TTE, 11/13/2023 1. Left ventricular ejection fraction, by estimation, is 50 to 55%. The left ventricle has low normal function. The left ventricle has no regional wall motion abnormalities. The left ventricular internal cavity size was mildly dilated. Left ventricular diastolic parameters were normal.   2. Right ventricular systolic function is  normal. The right ventricular size is normal. Tricuspid regurgitation signal is inadequate for assessing PA pressure.   3. The mitral valve is normal in structure. No evidence of mitral valve regurgitation. No evidence of mitral stenosis.   4. The aortic valve is normal in structure. Aortic valve regurgitation is not visualized. No aortic  stenosis is present.   5. The inferior vena cava is normal in size with greater than 50% respiratory variability, suggesting right atrial pressure of 3 mmHg.    Coronary CTA, 09/04/2023 1. Coronary calcium score of 0.  2. Normal coronary origin with left dominance.  3. No evidence of CAD.  4. CAD-RADS 0. Consider non-atherosclerotic causes of chest pain  Assessment and Plan    #) SVT #) palpitations #) anxiety S/p AVNRT ablation 12/2023 ***  Patient recently ran out of metoprolol , having difficulties obtaining from pharmacy d/t limited hours Encouraged her to visit [facility] pharmacy while she is here to follow-up on script Then [facility] STRONGLY encouraged her to start anxiety medication as recommended by PCP Consider obtaining Kardia[employer] to identify rhythm during episodes Monitor palpitation burden after resumption of toprol , consider adding PRN if continues to have episodes.     #) ***   #) ***   {Are you ordering a CV Procedure (e.g. stress test, cath, DCCV, TEE, etc)?   Press F2        :[id number]}   Current medicines are reviewed at length with the patient today.   The patient {ACTIONS; HAS/DOES NOT HAVE:19233} concerns regarding her medicines.  The following changes were made today:  {NONE DEFAULTED:18576}  Labs/ tests ordered today include: *** No orders of the defined types were placed in this encounter.    Disposition: Follow up with {EPMDS:28135::EP Team} or EP APP {EPFOLLOW UP:28173}   Signed, Chantal Needle, NP  08/19/2024  6:37 PM  Electrophysiology [facility]

## 2024-08-20 ENCOUNTER — Ambulatory Visit: Admitting: Cardiology
